# Patient Record
Sex: Male | Born: 1990 | Race: White | Hispanic: No | Marital: Single | Smoking: Never smoker
Health system: Southern US, Community
[De-identification: ages and names within clinical notes are randomized; demographics above are authoritative.]

## PROBLEM LIST (undated history)

## (undated) DIAGNOSIS — J302 Other seasonal allergic rhinitis: Secondary | ICD-10-CM

## (undated) DIAGNOSIS — F419 Anxiety disorder, unspecified: Secondary | ICD-10-CM

## (undated) DIAGNOSIS — F32A Depression, unspecified: Secondary | ICD-10-CM

## (undated) DIAGNOSIS — N200 Calculus of kidney: Secondary | ICD-10-CM

## (undated) DIAGNOSIS — Z87442 Personal history of urinary calculi: Secondary | ICD-10-CM

## (undated) DIAGNOSIS — H6693 Otitis media, unspecified, bilateral: Secondary | ICD-10-CM

## (undated) DIAGNOSIS — K219 Gastro-esophageal reflux disease without esophagitis: Secondary | ICD-10-CM

## (undated) DIAGNOSIS — F329 Major depressive disorder, single episode, unspecified: Secondary | ICD-10-CM

## (undated) DIAGNOSIS — J45909 Unspecified asthma, uncomplicated: Secondary | ICD-10-CM

## (undated) HISTORY — DX: Otitis media, unspecified, bilateral: H66.93

## (undated) HISTORY — DX: Other seasonal allergic rhinitis: J30.2

## (undated) HISTORY — DX: Unspecified asthma, uncomplicated: J45.909

## (undated) HISTORY — DX: Depression, unspecified: F32.A

## (undated) HISTORY — PX: ESOPHAGOGASTRODUODENOSCOPY ENDOSCOPY: SHX5814

## (undated) HISTORY — PX: SKIN LESION EXCISION: SHX2412

## (undated) HISTORY — DX: Anxiety disorder, unspecified: F41.9

## (undated) HISTORY — DX: Gastro-esophageal reflux disease without esophagitis: K21.9

## (undated) HISTORY — PX: WISDOM TOOTH EXTRACTION: SHX21

## (undated) HISTORY — DX: Calculus of kidney: N20.0

## (undated) HISTORY — DX: Major depressive disorder, single episode, unspecified: F32.9

---
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
## 2009-07-23 ENCOUNTER — Emergency Department (HOSPITAL_COMMUNITY): Admission: EM | Admit: 2009-07-23 | Discharge: 2009-07-24 | Payer: Self-pay | Admitting: Emergency Medicine

## 2013-06-05 DIAGNOSIS — F909 Attention-deficit hyperactivity disorder, unspecified type: Secondary | ICD-10-CM

## 2013-06-05 HISTORY — DX: Attention-deficit hyperactivity disorder, unspecified type: F90.9

## 2014-01-19 ENCOUNTER — Telehealth: Payer: Self-pay | Admitting: Medical

## 2014-01-19 ENCOUNTER — Ambulatory Visit (INDEPENDENT_AMBULATORY_CARE_PROVIDER_SITE_OTHER): Payer: 59 | Admitting: Medical

## 2014-01-19 ENCOUNTER — Encounter: Payer: Self-pay | Admitting: Medical

## 2014-01-19 VITALS — BP 112/72 | HR 80 | Temp 98.3°F | Resp 16 | Ht 69.5 in | Wt 173.0 lb

## 2014-01-19 DIAGNOSIS — R41840 Attention and concentration deficit: Secondary | ICD-10-CM

## 2014-01-19 DIAGNOSIS — Z559 Problems related to education and literacy, unspecified: Secondary | ICD-10-CM

## 2014-01-19 DIAGNOSIS — Z113 Encounter for screening for infections with a predominantly sexual mode of transmission: Secondary | ICD-10-CM

## 2014-01-19 DIAGNOSIS — Z0000 Encounter for general adult medical examination without abnormal findings: Secondary | ICD-10-CM

## 2014-01-19 LAB — POCT URINALYSIS DIPSTICK
Bilirubin, UA: NEGATIVE
GLUCOSE UA: NEGATIVE
Ketones, UA: NEGATIVE
Leukocytes, UA: NEGATIVE
NITRITE UA: NEGATIVE
PH UA: 7
Protein, UA: NEGATIVE
RBC UA: NEGATIVE
SPEC GRAV UA: 1.01
Urobilinogen, UA: NEGATIVE

## 2014-01-19 NOTE — Telephone Encounter (Signed)
Refer to [facility] for eval for possible adult ADD vs ruling out other.

## 2014-01-19 NOTE — Telephone Encounter (Signed)
Called [facility] to try to refer pt over but had to leave a message and waiting to get a call back

## 2014-01-19 NOTE — Telephone Encounter (Signed)
GrenadaBrittany from [facility] will e-mail me a referral form and i will fax over info and she will give him a call to set up the appt

## 2014-01-19 NOTE — Progress Notes (Addendum)
Subjective:   HPI  Bradley Douglas is a 23 y.o. male who presents for a complete physical.  New patient today   Preventative care: Last ophthalmology visit: 2007 Last dental visit: 2009 Last colonoscopy: n/a  Last prostate exam: n/a Last EKG: never Last labs: 2 years ago  Prior vaccinations: TD or Tdap: 2009 Influenza: never  Concerns: Wants STD screening today, sexually active.  Thinks he has ADHD.  Mother has ADHD, sisters seems to have the same, aunt has ADHD as well as maternal uncles.  He has been treated for depression and anxiety in the past.  Hasn't seen a doctor specifically for this prior (ADHD).  He did do a questionnaire at college that was abnormally high suggestive ADHD.  Can't focus on things, doesn't complete projects.  Stares off at work.  Falls behind at times with job responsibilities.  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   Has a Dance movement psychotherapistmentor, sets goals, but just can't seem to follow through on things.   Did fine in high school, but hasn't completed his degrees largely due to these focus concerns.   Reviewed their medical, surgical, family, social, medication, and allergy history and updated chart as appropriate.  Past Medical History  Diagnosis Date  . Childhood asthma   . Seasonal allergic rhinitis   . Chronic otitis media of both ears     childhood  . GERD (gastroesophageal reflux disease)   . Anxiety   . Depression     on treatment during '02-2012    Past Surgical History  Procedure Laterality Date  . Wisdom tooth extraction      History   Social History  . Marital Status: Married    Spouse Name: N/A    Number of Children: N/A  . Years of Education: N/A   Occupational History  . Not on file.   Social History Main Topics  . Smoking status: Current Some Day Smoker  . Smokeless tobacco: Not on file  . Alcohol Use: 5.4 oz/week    3 Glasses of wine, 3 Cans of beer, 3 Shots of liquor per week     Comment: 3x week  . Drug Use:  No  . Sexual Activity: Not on file   Other Topics Concern  . Not on file   Social History Narrative   Is taking a break from college, psychology degrees as of 01/2014, working currently as Government social research officerAssistant Salon Director, exercise with cardio, machine weights, stretching.   Was attending school at [employer], went to [employer] for a period of time as well   Lives with 2 friends.     Family History  Problem Relation Age of Onset  . ADD / ADHD Mother   . Mitral valve prolapse Mother   . Cancer Maternal Grandfather     leukemia  . Heart disease Neg Hx   . Diabetes Neg Hx   . Stroke Neg Hx   . Hypertension Other     paternal side    No current outpatient prescriptions on file.  Allergies  Allergen Reactions  . Amoxicillin Swelling    Review of Systems Constitutional: -fever, -chills, -sweats, -unexpected weight change, -decreased appetite, -fatigue Allergy: +sneezing(seasonal), -itching, -congestion Dermatology: -changing moles, --rash, -lumps ENT: -runny nose, -ear pain, -sore throat, -hoarseness, -sinus pain, -teeth pain, - ringing in ears, -hearing loss, -nosebleeds Cardiology: -chest pain, -palpitations, -swelling, -difficulty breathing when lying flat, -waking up short of breath Respiratory: -cough, -shortness of breath, -difficulty breathing with exercise or exertion, -  wheezing, -coughing up blood Gastroenterology: -abdominal pain, -nausea, -vomiting, -diarrhea, -constipation, -blood in stool, -changes in bowel movement, -difficulty swallowing or eating Hematology: -bleeding, -bruising  Musculoskeletal: +joint aches, -muscle aches, -joint swelling, +back pain, -neck pain, -cramping, -changes in gait, +knee pain  Ophthalmology: denies vision changes, eye redness, itching, discharge Urology: -burning with urination, -difficulty urinating, -blood in urine, -urinary frequency, -urgency, -incontinence Neurology: -headache, -weakness, -tingling, -numbness, -memory loss, -falls,  -dizziness Psychology: -depressed mood, -agitation, -sleep problems     Objective:   Physical Exam  BP 112/72  Pulse 80  Temp(Src) 98.3 F (36.8 C) (Oral)  Resp 16  Ht 5' 9.5" (1.765 m)  Wt 173 lb (78.472 kg)  BMI 25.19 kg/m2  General appearance: alert, no distress, WD/WN, white male Skin: scattered macules of torso, arms legs, no particular worrisome lesions, left forearm proximally with 2 6mm oval scars from prior skin biopsies, large tattoo spanning from mid chest to upper thigh along left side of body HEENT: normocephalic, conjunctiva/corneas normal, sclerae anicteric, PERRLA, EOMi, nares patent, no discharge or erythema, pharynx normal Oral cavity: MMM, tongue normal, teeth in good repair Neck: supple, no lymphadenopathy, no thyromegaly, no masses, normal ROM, no bruits Chest: non tender, normal shape and expansion Heart: RRR, normal S1, S2, no murmurs Lungs: CTA bilaterally, no wheezes, rhonchi, or rales Abdomen: +bs, soft, non tender, non distended, no masses, no hepatomegaly, no splenomegaly, no bruits Back: non tender, normal ROM, no scoliosis Musculoskeletal: upper extremities non tender, no obvious deformity, normal ROM throughout, lower extremities non tender, no obvious deformity, normal ROM throughout Extremities: no edema, no cyanosis, no clubbing Pulses: 2+ symmetric, upper and lower extremities, normal cap refill Neurological: alert, oriented x 3, CN2-12 intact, strength normal upper extremities and lower extremities, sensation normal throughout, DTRs 2+ throughout, no cerebellar signs, gait normal Psychiatric: normal affect, behavior normal, pleasant  GU: normal male external genitalia, circumcised, nontender, no masses, no hernia, no lymphadenopathy Rectal: deferred   Assessment and Plan :    Encounter Diagnoses  Name Primary?  . Routine general medical examination at a health care facility Yes  . Screen for STD (sexually transmitted disease)   . Attention  deficit   . School problem     Physical exam - discussed healthy lifestyle, diet, exercise, preventative care, vaccinations, and addressed their concerns.  He declines routine screening labs.  STD screening today, discussed safe sex. Advised yearly flu vaccine. Attention problem, school problems, focus concerns - Adult ADHD-RS-IV with adult prompt questionnaire with Inattentive score of 22 and Hyperactivity-Impulsivity score of 17.   Referral to psychology to help with diagnosis and treatment recommendations. Follow-up pending labs

## 2014-01-20 ENCOUNTER — Telehealth: Payer: Self-pay | Admitting: Medical

## 2014-01-20 LAB — GC/CHLAMYDIA PROBE AMP
CT PROBE, AMP APTIMA: NEGATIVE
GC PROBE AMP APTIMA: NEGATIVE

## 2014-01-20 LAB — HIV ANTIBODY (ROUTINE TESTING W REFLEX): HIV 1&2 Ab, 4th Generation: NONREACTIVE

## 2014-01-20 LAB — RPR

## 2014-01-20 NOTE — Telephone Encounter (Signed)
lm

## 2014-01-29 ENCOUNTER — Telehealth: Payer: Self-pay | Admitting: Internal Medicine

## 2014-01-29 NOTE — Telephone Encounter (Signed)
[employer] CALLED STATING THAT THEY DO NOT A PATIENT WITH THAT NAME THERE WITH RECORDS.Marland Kitchen JUST AN FYI

## 2014-01-29 NOTE — Telephone Encounter (Signed)
noted 

## 2019-03-25 ENCOUNTER — Ambulatory Visit: Payer: Self-pay | Admitting: Physician Assistant

## 2019-03-25 ENCOUNTER — Other Ambulatory Visit: Payer: Self-pay

## 2019-03-25 DIAGNOSIS — Z113 Encounter for screening for infections with a predominantly sexual mode of transmission: Secondary | ICD-10-CM

## 2019-03-25 DIAGNOSIS — Z202 Contact with and (suspected) exposure to infections with a predominantly sexual mode of transmission: Secondary | ICD-10-CM

## 2019-03-25 MED ORDER — AZITHROMYCIN 500 MG PO TABS
1000.0000 mg | ORAL_TABLET | Freq: Once | ORAL | Status: AC
  Administered 2019-03-25: 1000 mg via ORAL

## 2019-03-26 ENCOUNTER — Encounter: Payer: Self-pay | Admitting: Physician Assistant

## 2019-03-26 NOTE — Progress Notes (Signed)
    STI clinic/screening visit  Subjective:  Bradley Douglas is a 28 y.o. male being seen today for an STI screening visit. The patient reports they do not have symptoms.  Patient has the following medical conditions:  There are no active problems to display for this patient.    Chief Complaint  Patient presents with  . SEXUALLY TRANSMITTED DISEASE    HPI  Patient reports that he is a contact to Chlamydia.  Denies any symptoms today but would like a screening.    See flowsheet for further details and programmatic requirements.    The following portions of the patient's history were reviewed and updated as appropriate: allergies, current medications, past medical history, past social history, past surgical history and problem list.  Objective:  There were no vitals filed for this visit.  Physical Exam Constitutional:      General: He is not in acute distress.    Appearance: Normal appearance.  HENT:     Head: Normocephalic and atraumatic.     Mouth/Throat:     Mouth: Mucous membranes are moist.     Pharynx: Oropharynx is clear. No oropharyngeal exudate or posterior oropharyngeal erythema.  Eyes:     Conjunctiva/sclera: Conjunctivae normal.  Neck:     Musculoskeletal: Neck supple.  Pulmonary:     Effort: Pulmonary effort is normal.  Abdominal:     Palpations: Abdomen is soft. There is no mass.     Tenderness: There is no abdominal tenderness. There is no guarding or rebound.  Genitourinary:    Penis: Normal.      Scrotum/Testes: Normal.     Rectum: Normal.     Comments: Pubic area without nits, lice, edema, erythema, lesions or inguinal adenopathy. Penis uncircumcised and without discharge at meatus. Lymphadenopathy:     Cervical: No cervical adenopathy.  Skin:    General: Skin is warm and dry.     Findings: No bruising, erythema, lesion or rash.  Neurological:     Mental Status: He is alert and oriented to person, place, and time.  Psychiatric:        Mood  and Affect: Mood normal.        Behavior: Behavior normal.        Thought Content: Thought content normal.        Judgment: Judgment normal.       Assessment and Plan:  Bradley Douglas is a 28 y.o. male presenting to the [facility] for STI screening  1. Screening for STD (sexually transmitted disease) Patient is without symptoms today. Rec condoms with all sex. Await test results.  Counseled that RN will call if needs to RTC for further treatment once results are back.  - Chlamydia/Gonorrhea [facility] - HIV/HCV [facility] - Syphilis Serology, [facility] - [facility]  2. Chlamydia contact Will treat as a contact to Chlamydia with Azithromycin 1 g po DOT today. No sex for 7 days and until after partner completes treatment. RTC if vomits <2 hr after taking medicine for re-treatment. - azithromycin (ZITHROMAX) tablet 1,000 mg     No follow-ups on file.  No future appointments.  Jerene Dilling, PA

## 2019-04-02 LAB — HM HIV SCREENING LAB: HM HIV Screening: NEGATIVE

## 2019-04-02 LAB — HM HEPATITIS C SCREENING LAB: HM Hepatitis Screen: NEGATIVE

## 2019-11-17 ENCOUNTER — Other Ambulatory Visit: Payer: Self-pay | Admitting: Physician Assistant

## 2019-11-17 DIAGNOSIS — R042 Hemoptysis: Secondary | ICD-10-CM

## 2019-11-19 ENCOUNTER — Ambulatory Visit
Admission: RE | Admit: 2019-11-19 | Discharge: 2019-11-19 | Disposition: A | Discharge status: Home / Self Care | Payer: BLUE CROSS/BLUE SHIELD | Source: Ambulatory Visit | Attending: Physician Assistant | Admitting: Physician Assistant

## 2019-11-19 ENCOUNTER — Other Ambulatory Visit: Payer: Self-pay

## 2019-11-19 ENCOUNTER — Other Ambulatory Visit: Payer: Self-pay | Admitting: Physician Assistant

## 2019-11-19 ENCOUNTER — Ambulatory Visit
Admission: RE | Admit: 2019-11-19 | Discharge: 2019-11-19 | Disposition: A | Discharge status: Home / Self Care | Payer: BLUE CROSS/BLUE SHIELD | Attending: Physician Assistant | Admitting: Physician Assistant

## 2019-11-19 DIAGNOSIS — R042 Hemoptysis: Secondary | ICD-10-CM | POA: Diagnosis not present

## 2019-11-21 ENCOUNTER — Ambulatory Visit: Admission: RE | Admit: 2019-11-21 | Payer: BLUE CROSS/BLUE SHIELD | Source: Ambulatory Visit

## 2019-12-01 ENCOUNTER — Other Ambulatory Visit: Payer: Self-pay

## 2019-12-01 ENCOUNTER — Ambulatory Visit
Admission: RE | Admit: 2019-12-01 | Discharge: 2019-12-01 | Disposition: A | Discharge status: Home / Self Care | Payer: BLUE CROSS/BLUE SHIELD | Source: Ambulatory Visit | Attending: Physician Assistant | Admitting: Physician Assistant

## 2019-12-01 DIAGNOSIS — R042 Hemoptysis: Secondary | ICD-10-CM | POA: Diagnosis not present

## 2019-12-01 MED ORDER — IOHEXOL 300 MG/ML  SOLN
75.0000 mL | Freq: Once | INTRAMUSCULAR | Status: AC | PRN
  Administered 2019-12-01: 75 mL via INTRAVENOUS

## 2019-12-24 ENCOUNTER — Encounter: Payer: Self-pay | Admitting: Pulmonary Disease

## 2019-12-24 ENCOUNTER — Other Ambulatory Visit: Payer: Self-pay

## 2019-12-24 ENCOUNTER — Ambulatory Visit (INDEPENDENT_AMBULATORY_CARE_PROVIDER_SITE_OTHER): Payer: BLUE CROSS/BLUE SHIELD

## 2019-12-24 ENCOUNTER — Ambulatory Visit: Payer: BLUE CROSS/BLUE SHIELD | Admitting: Pulmonary Disease

## 2019-12-24 VITALS — BP 124/70 | HR 130 | Temp 98.4°F | Ht 70.0 in | Wt 194.6 lb

## 2019-12-24 DIAGNOSIS — R000 Tachycardia, unspecified: Secondary | ICD-10-CM | POA: Diagnosis not present

## 2019-12-24 DIAGNOSIS — R042 Hemoptysis: Secondary | ICD-10-CM

## 2019-12-24 DIAGNOSIS — R0601 Orthopnea: Secondary | ICD-10-CM

## 2019-12-24 DIAGNOSIS — K219 Gastro-esophageal reflux disease without esophagitis: Secondary | ICD-10-CM

## 2019-12-24 DIAGNOSIS — R0602 Shortness of breath: Secondary | ICD-10-CM

## 2019-12-24 MED ORDER — ESOMEPRAZOLE MAGNESIUM 40 MG PO CPDR: 40.0000 mg | DELAYED_RELEASE_CAPSULE | Freq: Every day | ORAL | 2 refills | Status: DC

## 2019-12-24 NOTE — Patient Instructions (Signed)
We are going to investigate with an event monitor (it used to be called a Holter monitor) to see what is going on with your heart rate and other issues.  Also I am going to check an echocardiogram.  We are going to get also a breathing tests.  Going to start you on medication for reflux.   We will see you back in 2 to 3 weeks time call sooner should any new difficulties arise.

## 2019-12-26 ENCOUNTER — Telehealth: Payer: Self-pay

## 2019-12-26 NOTE — Telephone Encounter (Signed)
Pt is aware of date/time of covid test.   

## 2019-12-29 ENCOUNTER — Other Ambulatory Visit
Admission: RE | Admit: 2019-12-29 | Discharge: 2019-12-29 | Disposition: A | Discharge status: Home / Self Care | Payer: BLUE CROSS/BLUE SHIELD | Source: Ambulatory Visit | Attending: Urology | Admitting: Urology

## 2019-12-29 ENCOUNTER — Other Ambulatory Visit: Payer: Self-pay

## 2019-12-29 DIAGNOSIS — Z20822 Contact with and (suspected) exposure to covid-19: Secondary | ICD-10-CM | POA: Insufficient documentation

## 2019-12-29 DIAGNOSIS — Z01812 Encounter for preprocedural laboratory examination: Secondary | ICD-10-CM | POA: Insufficient documentation

## 2019-12-30 LAB — SARS CORONAVIRUS 2 (TAT 6-24 HRS): SARS Coronavirus 2: NEGATIVE

## 2019-12-31 ENCOUNTER — Other Ambulatory Visit: Payer: Self-pay

## 2019-12-31 ENCOUNTER — Ambulatory Visit: Payer: BLUE CROSS/BLUE SHIELD | Attending: Pulmonary Disease

## 2019-12-31 DIAGNOSIS — R000 Tachycardia, unspecified: Secondary | ICD-10-CM

## 2019-12-31 DIAGNOSIS — R05 Cough: Secondary | ICD-10-CM | POA: Diagnosis not present

## 2019-12-31 DIAGNOSIS — R0602 Shortness of breath: Secondary | ICD-10-CM | POA: Diagnosis not present

## 2019-12-31 DIAGNOSIS — R0601 Orthopnea: Secondary | ICD-10-CM | POA: Diagnosis not present

## 2019-12-31 DIAGNOSIS — R0682 Tachypnea, not elsewhere classified: Secondary | ICD-10-CM | POA: Insufficient documentation

## 2019-12-31 LAB — PULMONARY FUNCTION TEST ARMC ONLY
DL/VA % pred: 125 %
DL/VA: 6.13 ml/min/mmHg/L
DLCO unc % pred: 127 %
DLCO unc: 41.34 ml/min/mmHg
FEF 25-75 Post: 3.89 L/sec
FEF 25-75 Pre: 4.63 L/sec
FEF2575-%Change-Post: -16 %
FEF2575-%Pred-Post: 87 %
FEF2575-%Pred-Pre: 104 %
FEV1-%Change-Post: -3 %
FEV1-%Pred-Post: 88 %
FEV1-%Pred-Pre: 92 %
FEV1-Post: 3.94 L
FEV1-Pre: 4.09 L
FEV1FVC-%Change-Post: -7 %
FEV1FVC-%Pred-Pre: 106 %
FEV6-%Change-Post: 4 %
FEV6-%Pred-Post: 90 %
FEV6-%Pred-Pre: 86 %
FEV6-Post: 4.9 L
FEV6-Pre: 4.67 L
FEV6FVC-%Pred-Post: 101 %
FEV6FVC-%Pred-Pre: 101 %
FVC-%Change-Post: 4 %
FVC-%Pred-Post: 89 %
FVC-%Pred-Pre: 86 %
FVC-Post: 4.9 L
Post FEV1/FVC ratio: 81 %
Post FEV6/FVC ratio: 100 %
Pre FEV1/FVC ratio: 87 %
Pre FEV6/FVC Ratio: 100 %
RV % pred: 90 %
RV: 1.49 L
TLC % pred: 93 %
TLC: 6.48 L

## 2019-12-31 MED ORDER — ALBUTEROL SULFATE (2.5 MG/3ML) 0.083% IN NEBU
2.5000 mg | INHALATION_SOLUTION | Freq: Once | RESPIRATORY_TRACT | Status: AC
  Administered 2019-12-31: 2.5 mg via RESPIRATORY_TRACT
  Filled 2019-12-31: qty 3

## 2020-01-06 ENCOUNTER — Other Ambulatory Visit: Payer: Self-pay

## 2020-01-06 ENCOUNTER — Ambulatory Visit
Admission: RE | Admit: 2020-01-06 | Discharge: 2020-01-06 | Disposition: A | Discharge status: Home / Self Care | Payer: BLUE CROSS/BLUE SHIELD | Source: Ambulatory Visit | Attending: Pulmonary Disease | Admitting: Pulmonary Disease

## 2020-01-06 DIAGNOSIS — R0601 Orthopnea: Secondary | ICD-10-CM | POA: Diagnosis not present

## 2020-01-06 DIAGNOSIS — R000 Tachycardia, unspecified: Secondary | ICD-10-CM

## 2020-01-06 LAB — ECHOCARDIOGRAM COMPLETE
AR max vel: 2.76 cm2
AV Area VTI: 2.4 cm2
AV Area mean vel: 2.62 cm2
AV Mean grad: 3.5 mmHg
AV Peak grad: 6 mmHg
Ao pk vel: 1.22 m/s
Area-P 1/2: 3.89 cm2
S' Lateral: 3.42 cm

## 2020-01-06 NOTE — Progress Notes (Signed)
*  PRELIMINARY RESULTS* Echocardiogram 2D Echocardiogram has been performed.  Bradley Douglas 01/06/2020, 11:39 AM

## 2020-01-09 ENCOUNTER — Other Ambulatory Visit: Payer: Self-pay

## 2020-01-09 ENCOUNTER — Encounter: Payer: Self-pay | Admitting: Cardiology

## 2020-01-09 ENCOUNTER — Ambulatory Visit: Payer: BLUE CROSS/BLUE SHIELD | Admitting: Cardiology

## 2020-01-09 VITALS — BP 118/80 | HR 109 | Ht 70.0 in | Wt 194.0 lb

## 2020-01-09 DIAGNOSIS — R000 Tachycardia, unspecified: Secondary | ICD-10-CM

## 2020-01-09 DIAGNOSIS — K2100 Gastro-esophageal reflux disease with esophagitis, without bleeding: Secondary | ICD-10-CM | POA: Diagnosis not present

## 2020-01-09 MED ORDER — METOPROLOL SUCCINATE ER 50 MG PO TB24: 25.0000 mg | ORAL_TABLET | Freq: Every day | ORAL | 3 refills | Status: DC

## 2020-01-09 NOTE — Progress Notes (Signed)
Cardiology Office Note:    Date:  01/09/2020   ID:  Bradley Douglas, DOB 22-Mar-1991, MRN [id number]  PCP:  Jac Canavan, PA-C  [facility] Cardiologist:  Debbe Odea, MD  [facility] Electrophysiologist:  None   Referring MD: Salena Saner, MD   Chief Complaint  Patient presents with  . New Patient (Initial Visit)    Referred for tachycardia. Medications verbally reviewed with patient.    Bradley Douglas is a 29 y.o. male who is being seen today for the evaluation of tachycardia at the request of Salena Saner, MD.   History of Present Illness:    Bradley Douglas is a 29 y.o. male with a hx of anxiety, depression, ADHD ,GERD who presents due to tachycardia.  Patient states having symptoms of elevated heart rates for over a year now.  He sometimes feels palpitations.  He has a smart watch and has noted heart rates up to 120s.  He takes Vyvanse for ADHD for about 5 years now.  He otherwise denies any history of cardiac disease.  He is working with his ADHD physician to decrease his Vyvanse dose.   Echocardiogram on 01/13/2020 showed normal systolic function, EF 50 to 55%.  Past Medical History:  Diagnosis Date  . Anxiety   . Childhood asthma   . Chronic otitis media of both ears    childhood  . Depression    on treatment during '02-2012  . GERD (gastroesophageal reflux disease)   . Seasonal allergic rhinitis     Past Surgical History:  Procedure Laterality Date  . WISDOM TOOTH EXTRACTION      Current Medications: Current Meds  Medication Sig  . buPROPion (WELLBUTRIN XL) 300 MG 24 hr tablet Take 300 mg by mouth daily.  Marland Kitchen esomeprazole (NEXIUM) 40 MG capsule Take 1 capsule (40 mg total) by mouth daily.  Marland Kitchen lisdexamfetamine (VYVANSE) 60 MG capsule Take 1 capsule by mouth daily.     Allergies:   Amoxicillin   Social History   Socioeconomic History  . Marital status: Single    Spouse name: Not on file  . Number of children: Not on file    . Years of education: Not on file  . Highest education level: Not on file  Occupational History  . Not on file  Tobacco Use  . Smoking status: Never Smoker  . Smokeless tobacco: Never Used  Vaping Use  . Vaping Use: Never used  Substance and Sexual Activity  . Alcohol use: Yes    Alcohol/week: 9.0 standard drinks    Types: 3 Glasses of wine, 3 Cans of beer, 3 Shots of liquor per week    Comment: 3x week  . Drug use: No  . Sexual activity: Yes    Birth control/protection: Condom  Other Topics Concern  . Not on file  Social History Narrative   Is taking a break from college, psychology degrees as of 01/2014, working currently as Government social research officer, exercise with cardio, machine weights, stretching.   Was attending school at [employer], went to [employer] for a period of time as well   Lives with 2 friends.    Social Determinants of Health   Financial Resource Strain:   . Difficulty of Paying Living Expenses:   Food Insecurity:   . Worried About Programme researcher, broadcasting/film/video in the Last Year:   . Barista in the Last Year:   Transportation Needs:   . Freight forwarder (Medical):   Marland Kitchen  Lack of Transportation (Non-Medical):   Physical Activity:   . Days of Exercise per Week:   . Minutes of Exercise per Session:   Stress:   . Feeling of Stress :   Social Connections:   . Frequency of Communication with Friends and Family:   . Frequency of Social Gatherings with Friends and Family:   . Attends Religious Services:   . Active Member of Clubs or Organizations:   . Attends Banker Meetings:   Marland Kitchen Marital Status:      Family History: The patient's family history includes ADD / ADHD in his mother; Cancer in his maternal grandfather; Hypertension in an other family member; Mitral valve prolapse in his mother. There is no history of Heart disease, Diabetes, or Stroke.  ROS:   Please see the history of present illness.     All other systems reviewed and are  negative.  EKGs/Labs/Other Studies Reviewed:    The following studies were reviewed today:   EKG:  EKG is  ordered today.  The ekg ordered today demonstrates sinus tachycardia otherwise normal ECG, heart rate 109  Recent Labs: No results found for requested labs within last 8760 hours.  Recent Lipid Panel No results found for: CHOL, TRIG, HDL, CHOLHDL, VLDL, LDLCALC, LDLDIRECT  Physical Exam:    VS:  BP 118/80 (BP Location: Right Arm, Cuff Size: Normal)   Pulse (!) 109   Ht 5\' 10"  (1.778 m)   Wt 194 lb (88 kg)   SpO2 98%   BMI 27.84 kg/m     Wt Readings from Last 3 Encounters:  01/09/20 194 lb (88 kg)  12/24/19 194 lb 9.6 oz (88.3 kg)  01/19/14 173 lb (78.5 kg)     GEN:  Well nourished, well developed in no acute distress HEENT: Normal NECK: No JVD; No carotid bruits LYMPHATICS: No lymphadenopathy CARDIAC: Tachycardic, regular, no murmurs, rubs, gallops RESPIRATORY:  Clear to auscultation without rales, wheezing or rhonchi  ABDOMEN: Soft, non-tender, non-distended MUSCULOSKELETAL:  No edema; No deformity  SKIN: Warm and dry NEUROLOGIC:  Alert and oriented x 3 PSYCHIATRIC:  Normal affect   ASSESSMENT:    1. Sinus tachycardia   2. Gastroesophageal reflux disease with esophagitis without hemorrhage    PLAN:    In order of problems listed above:  1. Patient with sinus tachycardia likely secondary to medication/Vyvanse.  Last echocardiogram showed low normal systolic function, EF 50 to 55%.  Prolonged tachycardiac and sometimes decreased LV function.  We will start Toprol-XL 25 mg daily to help with elevated heart rates.  Plan to titrate as needed depending on patient's symptoms and heart rate. 2. History of GERD, continue PPI.  Follow-up in 6 weeks.  This note was generated in part or whole with voice recognition software. Voice recognition is usually quite accurate but there are transcription errors that can and very often do occur. I apologize for any  typographical errors that were not detected and corrected.  Medication Adjustments/Labs and Tests Ordered: Current medicines are reviewed at length with the patient today.  Concerns regarding medicines are outlined above.  Orders Placed This Encounter  Procedures  . EKG 12-Lead   Meds ordered this encounter  Medications  . metoprolol succinate (TOPROL-XL) 50 MG 24 hr tablet    Sig: Take 0.5 tablets (25 mg total) by mouth daily. Take with or immediately following a meal.    Dispense:  15 tablet    Refill:  3    Patient Instructions  Medication Instructions:  Your physician has recommended you make the following change in your medication:  START taking metoprolol succinate (TOPROL-XL) 50 MG 24 hr tablet: Take 0.5 tablets (25 mg total) by mouth daily.  *If you need a refill on your cardiac medications before your next appointment, please call your pharmacy*   Lab Work: None Ordered If you have labs (blood work) drawn today and your tests are completely normal, you will receive your results only by: Marland Kitchen MyChart Message (if you have MyChart) OR . A paper copy in the mail If you have any lab test that is abnormal or we need to change your treatment, we will call you to review the results.   Testing/Procedures: None Ordered   Follow-Up: At [facility], you and your health needs are our priority.  As part of our continuing mission to provide you with exceptional heart care, we have created designated Provider Care Teams.  These Care Teams include your primary Cardiologist (physician) and Advanced Practice Providers (APPs -  Physician Assistants and Nurse Practitioners) who all work together to provide you with the care you need, when you need it.  We recommend signing up for the patient portal called "MyChart".  Sign up information is provided on this After Visit Summary.  MyChart is used to connect with patients for Virtual Visits (Telemedicine).  Patients are able to view  lab/test results, encounter notes, upcoming appointments, etc.  Non-urgent messages can be sent to your provider as well.   To learn more about what you can do with MyChart, go to [email or web address].    Your next appointment:   6 week(s)  The format for your next appointment:   In Person  Provider:   Debbe Odea, MD   Other Instructions      Signed, Debbe Odea, MD  01/09/2020 1:23 PM    [facility]

## 2020-01-09 NOTE — Patient Instructions (Signed)
Medication Instructions:   Your physician has recommended you make the following change in your medication:  START taking metoprolol succinate (TOPROL-XL) 50 MG 24 hr tablet: Take 0.5 tablets (25 mg total) by mouth daily.  *If you need a refill on your cardiac medications before your next appointment, please call your pharmacy*   Lab Work: None Ordered If you have labs (blood work) drawn today and your tests are completely normal, you will receive your results only by: Marland Kitchen MyChart Message (if you have MyChart) OR . A paper copy in the mail If you have any lab test that is abnormal or we need to change your treatment, we will call you to review the results.   Testing/Procedures: None Ordered   Follow-Up: At [facility], you and your health needs are our priority.  As part of our continuing mission to provide you with exceptional heart care, we have created designated Provider Care Teams.  These Care Teams include your primary Cardiologist (physician) and Advanced Practice Providers (APPs -  Physician Assistants and Nurse Practitioners) who all work together to provide you with the care you need, when you need it.  We recommend signing up for the patient portal called "MyChart".  Sign up information is provided on this After Visit Summary.  MyChart is used to connect with patients for Virtual Visits (Telemedicine).  Patients are able to view lab/test results, encounter notes, upcoming appointments, etc.  Non-urgent messages can be sent to your provider as well.   To learn more about what you can do with MyChart, go to [email or web address].    Your next appointment:   6 week(s)  The format for your next appointment:   In Person  Provider:   Debbe Odea, MD   Other Instructions

## 2020-01-13 ENCOUNTER — Ambulatory Visit: Payer: BLUE CROSS/BLUE SHIELD | Admitting: Pulmonary Disease

## 2020-01-13 ENCOUNTER — Other Ambulatory Visit: Payer: Self-pay

## 2020-01-13 ENCOUNTER — Encounter: Payer: Self-pay | Admitting: Pulmonary Disease

## 2020-01-13 VITALS — BP 124/76 | HR 85 | Temp 98.4°F | Ht 70.0 in | Wt 193.8 lb

## 2020-01-13 DIAGNOSIS — K219 Gastro-esophageal reflux disease without esophagitis: Secondary | ICD-10-CM

## 2020-01-13 DIAGNOSIS — R0602 Shortness of breath: Secondary | ICD-10-CM

## 2020-01-13 DIAGNOSIS — Z8719 Personal history of other diseases of the digestive system: Secondary | ICD-10-CM

## 2020-01-13 NOTE — Progress Notes (Signed)
Subjective:    Patient ID: Bradley Douglas, male    DOB: [birth year], 29 y.o.   MRN: [id number] Patient Care Team: Pcp, No as PCP - General Debbe Odea, MD as PCP - Cardiology (Cardiology)  Chief Complaint  Patient presents with   Follow-up    breathing has improved since starting Nexium. c/o mild throat clearing.     HPI Patient is a 29 year old previously seen for the issue of dyspnea on 24 December 2019.  At that time he was noted to have significant tachycardia in the setting of ADHD medications as well as hemoptysis.  He was referred to cardiology and placed on metoprolol.  This medication has helped significantly with his shortness of breath.  He was initially referred by ENT for what appeared to have been a follow-up with a follicular cyst.  He endorses at that time having "hemoptysis" which per the examiner was noted to be half a teaspoon, bright red however the patient states that is more streaky in nature.  The patient states that he has had cough for approximately a year and a half and that the streaky "hemoptysis" roughly 2 months prior.  The patient was initially evaluated on 24 December 2019 at that time it was noted that the patient had CT soft tissue neck and chest CT both of these studies were unremarkable.  On further questioning it was noted that the patient was having significant gastroesophageal reflux symptoms, chest discomfort and the "hemoptysis" was actually hematemesis.  We instituted therapy with Nexium 40 mg daily and referred the patient to GI.  GI concurred that the patient was likely having hematemesis.  This issue has now resolved with the Nexium.  At present, he has not had EGD however he has been advised by GI that if his symptoms recur he will need EGD.  The patient notes that between the metoprolol and Nexium his shortness of breath has improved dramatically.  His cough is also resolved he only has occasional mild throat clearing.  He does not endorse any other  symptomatology.  The patient has had cardiac evaluation, event monitor and echocardiogram all which have been normal.  Overall he feels well and looks well.   Review of Systems A 10 point review of systems was performed and it is as noted above otherwise negative.  Past Medical History:  Diagnosis Date   ADHD 2015   Anxiety    Chronic otitis media of both ears    childhood   Depression    on treatment during '02-2012   GERD (gastroesophageal reflux disease)    History of kidney stones    Seasonal allergic rhinitis    Past Surgical History:  Procedure Laterality Date   ESOPHAGOGASTRODUODENOSCOPY ENDOSCOPY     SKIN LESION EXCISION     abd inner thigh back left thigh   WISDOM TOOTH EXTRACTION     Family History  Problem Relation Age of Onset   ADD / ADHD Mother    Mitral valve prolapse Mother    Cancer Maternal Grandfather        leukemia   Hypertension Other        paternal side   Cancer Maternal Grandmother    Heart disease Neg Hx    Diabetes Neg Hx    Stroke Neg Hx    Social History   Tobacco Use   Smoking status: Never   Smokeless tobacco: Never  Substance Use Topics   Alcohol use: Yes    Alcohol/week: 6.0 standard  drinks of alcohol    Types: 3 Cans of beer, 3 Shots of liquor per week    Comment: once a month   Allergies  Allergen Reactions   Amoxicillin Swelling    As a child.  Swelling of face and red blotches   Current Meds  Medication Sig   buPROPion (WELLBUTRIN XL) 150 MG 24 hr tablet Take 150 mg by mouth daily.   esomeprazole (NEXIUM) 40 MG capsule Take 1 capsule (40 mg total) by mouth daily.   lisdexamfetamine (VYVANSE) 60 MG capsule Take 1 capsule by mouth daily.   metoprolol succinate (TOPROL-XL) 50 MG 24 hr tablet Take 0.5 tablets (25 mg total) by mouth daily. Take with or immediately following a meal.   Immunization History  Administered Date(s) Administered   PFIZER(Purple Top)SARS-COV-2 Vaccination 09/15/2019, 10/16/2019        Objective:   Physical Exam BP 124/76 (BP Location: Left Arm, Cuff Size: Normal)   Pulse 85   Temp 98.4 F (36.9 C) (Temporal)   Ht 5\' 10"  (1.778 m)   Wt 193 lb 12.8 oz (87.9 kg)   SpO2 96%   BMI 27.81 kg/m   SpO2: 96 % O2 Device: None (Room air)  GENERAL: Well-developed, well-nourished gentleman, no acute distress.  Fully ambulatory. HEAD: Normocephalic, atraumatic.  EYES: Pupils equal, round, reactive to light.  No scleral icterus.  MOUTH: Nose/mouth/throat not examined due to institutional masking requirements for COVID-19. NECK: Supple. No thyromegaly. Trachea midline. No JVD.  No adenopathy. PULMONARY: Good air entry bilaterally.  No adventitious sounds. CARDIOVASCULAR: S1 and S2. Regular rate and rhythm.  No rubs, murmurs or gallops heard. ABDOMEN: Benign. MUSCULOSKELETAL: No joint deformity, no clubbing, no edema.  NEUROLOGIC: No overt focal deficit, no gait disturbance, speech is fluent. SKIN: Intact,warm,dry. PSYCH: Mood and behavior normal      Assessment & Plan:     ICD-10-CM   1. SOB (shortness of breath)  R06.02    Resolved Notes metoprolol and Nexium have helped    2. Gastroesophageal reflux disease, unspecified whether esophagitis present  K21.9    Suspect patient had mild esophagitis Symptoms totally resolved with Nexium    3. History of hematemesis  Z87.19    Initially described as "hemoptysis" This was actually hematemesis Resolved      Patient has been instructed to continue his medications.  Will see the patient back in 2 to 3 months time he is to contact us sooner should any new difficulties arise.  Gailen Shelter, MD Advanced [facility]ch[facility]    *This note was dictated using voice recognition software/Dragon.  Despite best efforts to proofread, errors can occur which can change the meaning. Any transcriptional errors that result from this process are unintentional and may not be fully corrected at the time  of dictation.

## 2020-01-13 NOTE — Patient Instructions (Signed)
All of your test came back normal which is good.  I am glad that the Nexium is working for you and also the metoprolol for your heart.  Continue your medications and we will see you back in 2 to 3 months time call sooner should any new difficulties arise.

## 2020-01-13 NOTE — Progress Notes (Signed)
Noted, thank you for your help with this patient.

## 2020-02-12 ENCOUNTER — Other Ambulatory Visit: Payer: Self-pay

## 2020-02-12 ENCOUNTER — Ambulatory Visit: Payer: BLUE CROSS/BLUE SHIELD | Admitting: Nurse Practitioner

## 2020-02-12 ENCOUNTER — Encounter: Payer: Self-pay | Admitting: Nurse Practitioner

## 2020-02-12 ENCOUNTER — Other Ambulatory Visit (HOSPITAL_COMMUNITY)
Admission: RE | Admit: 2020-02-12 | Discharge: 2020-02-12 | Disposition: A | Discharge status: Home / Self Care | Payer: BLUE CROSS/BLUE SHIELD | Source: Ambulatory Visit | Attending: Nurse Practitioner | Admitting: Nurse Practitioner

## 2020-02-12 VITALS — Temp 98.9°F | Ht 70.0 in | Wt 199.4 lb

## 2020-02-12 DIAGNOSIS — E663 Overweight: Secondary | ICD-10-CM | POA: Diagnosis not present

## 2020-02-12 DIAGNOSIS — N5319 Other ejaculatory dysfunction: Secondary | ICD-10-CM | POA: Diagnosis present

## 2020-02-12 DIAGNOSIS — Z0000 Encounter for general adult medical examination without abnormal findings: Secondary | ICD-10-CM

## 2020-02-12 DIAGNOSIS — K6289 Other specified diseases of anus and rectum: Secondary | ICD-10-CM

## 2020-02-12 DIAGNOSIS — N50811 Right testicular pain: Secondary | ICD-10-CM | POA: Diagnosis present

## 2020-02-12 DIAGNOSIS — F909 Attention-deficit hyperactivity disorder, unspecified type: Secondary | ICD-10-CM

## 2020-02-12 DIAGNOSIS — R300 Dysuria: Secondary | ICD-10-CM

## 2020-02-12 DIAGNOSIS — Z6828 Body mass index (BMI) 28.0-28.9, adult: Secondary | ICD-10-CM | POA: Diagnosis not present

## 2020-02-12 DIAGNOSIS — N50812 Left testicular pain: Secondary | ICD-10-CM

## 2020-02-12 NOTE — Progress Notes (Signed)
Established Patient Office Visit  Subjective:  Patient ID: Bradley Douglas, male    DOB: 04-Apr-1991  Age: 29 y.o. MRN: [id number]  CC:  Chief Complaint  Patient presents with  . New Patient (Initial Visit)    Testicular pain x2years,  Senstive testicles. Feels a lump in testicles during self evaluation. Ejaculate Is solid and holding shape. no discoloration. Pain in area between anus and Genitals. Experiencing a Twisting pain in testicles. Has not been hit in genitals. No pain when urinating or UTI symtpoms. Feeling that something is stuck in urethra during urination/ejaculation  . Hemorrhoids    HPI Bradley Douglas is a 29 year old male with a history of anxiety, depression, ADHD, GERD, tachycardia, nephrolithiasis, who presents to establish care with primary care.  He has concerns about a 2-year history of intermittent testicular pain, and abnormal ejaculation.  He also has pain in the rectal area and thinks his prostate may be irritated.  He has had anal pain at times with a hard bowel movement, and has seen a little pink on the tissue in the past.  He takes Metamucil and has regular bowel movements daily.  He reports he did not get health insurance until this year, as explanation for delay in seeking medical care.  Testicular pain mainly  the left and sometimes the right: Non tender to touch, and not swollen, but the left may feel a little more spongy than the right.  He has noted no penile discharge, or hematuria.  He reports his ejaculate appears thick sometimes and even solid-like.  He has seen no blood in the ejaculate.  He may have a little urinary urgency at times but no burning, cloudy urine, frequency, or flank pain.  No fevers or chills.  He will get bilateral lower back discomfort if he sits a lot at his desk.  He reports a history of kidney stones, treated in [place] by urologist, but is not active.  He does not think he has a current kidney stone.  He currently does not have  a male sexual partner.  He reports no sexual activity in almost a year and last STI comprehensive screening was negative October 2020.  Rectal pain: He reports that sometimes when he has a bowel movement he feels like he is passing a jagged rock.  He has no symptoms currently.  He has noted no hemorrhoids, and reports no history of fissures or tears. Light pink on the tissue. Reports a normal  BM on metamucil- no constipation. He has an appointment with Dr. Servando Snare next Tues arranged.    Chronic cough with hemoptysis:He saw Dr. Jayme Cloud in June  for chronic cough of 1-2 year duration and onset of hemoptysis in April, SOB, pain left ear and trouble swallowing.  She prescribed Nexium and his cough improved.  11/20/2019: CXR: Heart size and mediastinal contours are within normal.  Both lungs are clear.  No active cardiopulmonary disease.  12/01/2019 CT soft tissue of the neck with contrast: No soft tissue neck mass or pathologically enlarged cervical chain lymph nodes identified.  Unremarkable exam.  12/01/2019 chest CT with contrast: Negative for acute abnormality to correlate with his clinical symptoms.  He had a chest x-ray.   GERD: He presents on Nexium which has controlled chronic cough.    Recommend GI  opinion on need for EGD.  Tachycardia: He has heart rate up to 120s.  He was placed on Toprol-XL 25 mg daily by cardiology.  Echocardiogram on 01/13/2020 showed normal  systolic function, EF 50 to 55%.  The cause of the tachycardia may be related to the Vyvanse.   Depression/anxiety/ADHD: Maintained on Wellbutrin 300 mg in 24 hours and Vyvanse 60 mg daily for ADHD for about 5 years. He has been working with Dr. Andria Meuse at [place] attention specialist to titrate the dose down.   Past Medical History:  Diagnosis Date  . ADHD 2015  . Anxiety   . Chronic otitis media of both ears    childhood  . Depression    on treatment during '02-2012  . GERD (gastroesophageal reflux disease)   .  Nephrolithiasis   . Seasonal allergic rhinitis     Past Surgical History:  Procedure Laterality Date  . WISDOM TOOTH EXTRACTION      Family History  Problem Relation Age of Onset  . ADD / ADHD Mother   . Mitral valve prolapse Mother   . Cancer Maternal Grandfather        leukemia  . Hypertension Other        paternal side  . Cancer Maternal Grandmother   . Heart disease Neg Hx   . Diabetes Neg Hx   . Stroke Neg Hx     Social History   Socioeconomic History  . Marital status: Single    Spouse name: Not on file  . Number of children: Not on file  . Years of education: Not on file  . Highest education level: Not on file  Occupational History  . Occupation: Multimedia programmer   Tobacco Use  . Smoking status: Never Smoker  . Smokeless tobacco: Never Used  Vaping Use  . Vaping Use: Never used  Substance and Sexual Activity  . Alcohol use: Yes    Alcohol/week: 9.0 standard drinks    Types: 3 Glasses of wine, 3 Cans of beer, 3 Shots of liquor per week    Comment: 3x week  . Drug use: No  . Sexual activity: Yes    Birth control/protection: Condom    Comment: Sexual partners: male   Other Topics Concern  . Not on file  Social History Narrative   He works as a Multimedia programmer.       Social Determinants of Health   Financial Resource Strain:   . Difficulty of Paying Living Expenses: Not on file  Food Insecurity:   . Worried About Programme researcher, broadcasting/film/video in the Last Year: Not on file  . Ran Out of Food in the Last Year: Not on file  Transportation Needs:   . Lack of Transportation (Medical): Not on file  . Lack of Transportation (Non-Medical): Not on file  Physical Activity:   . Days of Exercise per Week: Not on file  . Minutes of Exercise per Session: Not on file  Stress:   . Feeling of Stress : Not on file  Social Connections:   . Frequency of Communication with Friends and Family: Not on file  . Frequency of Social Gatherings with Friends and Family: Not on  file  . Attends Religious Services: Not on file  . Active Member of Clubs or Organizations: Not on file  . Attends Banker Meetings: Not on file  . Marital Status: Not on file  Intimate Partner Violence:   . Fear of Current or Ex-Partner: Not on file  . Emotionally Abused: Not on file  . Physically Abused: Not on file  . Sexually Abused: Not on file    Outpatient Medications Prior to Visit  Medication Sig  Dispense Refill  . buPROPion (WELLBUTRIN XL) 300 MG 24 hr tablet Take 300 mg by mouth daily.    Marland Kitchen esomeprazole (NEXIUM) 40 MG capsule Take 1 capsule (40 mg total) by mouth daily. 30 capsule 2  . lisdexamfetamine (VYVANSE) 60 MG capsule Take 1 capsule by mouth daily.    . metoprolol succinate (TOPROL-XL) 50 MG 24 hr tablet Take 0.5 tablets (25 mg total) by mouth daily. Take with or immediately following a meal. 15 tablet 3   No facility-administered medications prior to visit.    Allergies  Allergen Reactions  . Amoxicillin Swelling    Review of Systems  Constitutional: Positive for fever. Negative for chills.  HENT: Negative.   Eyes: Negative.   Respiratory: Negative.   Cardiovascular: Negative.   Gastrointestinal: Positive for blood in stool, constipation and rectal pain. Negative for abdominal pain, diarrhea, nausea and vomiting.       GERD is controlled on Nexium daily- ordered by PULM for cough. No asthma. He has been on it less month. GI appt- this month.   Endocrine: Negative.   Genitourinary: Positive for dysuria, scrotal swelling and testicular pain. Negative for decreased urine volume, difficulty urinating, discharge, flank pain, frequency, hematuria, penile pain, penile swelling and urgency.  Musculoskeletal: Positive for back pain.       Lumbar back off and on - not now.   Skin: Negative.   Allergic/Immunologic: Positive for environmental allergies.       Nasocort for dust mites.   Neurological: Negative for dizziness, seizures, light-headedness and  headaches.  Hematological: Negative for adenopathy. Does not bruise/bleed easily.  Psychiatric/Behavioral:       History of ADHD- and depression and anxiety improved. Goes to therapy. On Wellbutrin prescribed by ADHD specialist. Beta blocker helps with the physical SE of Vyvanse.  No SI/HI.      Objective:    Physical Exam Vitals reviewed. Exam conducted with a chaperone present.  Constitutional:      Appearance: Normal appearance. He is normal weight.  HENT:     Head: Normocephalic.  Eyes:     Pupils: Pupils are equal, round, and reactive to light.  Cardiovascular:     Rate and Rhythm: Normal rate and regular rhythm.     Pulses: Normal pulses.     Heart sounds: Normal heart sounds.  Pulmonary:     Effort: Pulmonary effort is normal.     Breath sounds: Normal breath sounds.  Abdominal:     Palpations: Abdomen is soft.     Tenderness: There is no abdominal tenderness.     Hernia: There is no hernia in the left inguinal area or right inguinal area.  Genitourinary:    Penis: Normal.      Testes: Normal.        Right: Mass, tenderness, swelling or testicular hydrocele not present.        Left: Mass, tenderness, swelling or testicular hydrocele not present.     Rectum: No anal fissure or external hemorrhoid.     Comments: Visual inspection of the rectum shows no hemorrhoid or fissure.  Musculoskeletal:        General: Normal range of motion.     Cervical back: Neck supple.  Lymphadenopathy:     Lower Body: No right inguinal adenopathy. No left inguinal adenopathy.  Skin:    General: Skin is warm and dry.  Neurological:     Mental Status: He is alert and oriented to person, place, and time.  Psychiatric:  Mood and Affect: Mood normal.        Behavior: Behavior normal.     Temp 98.9 F (37.2 C)   Ht 5\' 10"  (1.778 m)   Wt 199 lb 6.4 oz (90.4 kg)   BMI 28.61 kg/m  Wt Readings from Last 3 Encounters:  02/12/20 199 lb 6.4 oz (90.4 kg)  01/13/20 193 lb 12.8 oz  (87.9 kg)  01/09/20 194 lb (88 kg)   Pulse Readings from Last 3 Encounters:  01/13/20 85  01/09/20 (!) 109  12/24/19 (!) 130    BP Readings from Last 3 Encounters:  01/13/20 124/76  01/09/20 118/80  12/24/19 124/70    Lab Results  Component Value Date   CHOL 184 02/12/2020   HDL 46.50 02/12/2020   LDLDIRECT 99.0 02/12/2020   TRIG 227.0 (H) 02/12/2020   CHOLHDL 4 02/12/2020      Health Maintenance Due  Topic Date Due  . TETANUS/TDAP  Never done  . INFLUENZA VACCINE  Never done    There are no preventive care reminders to display for this patient.  Lab Results  Component Value Date   TSH 2.52 02/12/2020   Lab Results  Component Value Date   WBC 7.1 02/12/2020   HGB 17.3 (H) 02/12/2020   HCT 49.7 02/12/2020   MCV 85.9 02/12/2020   PLT 313.0 02/12/2020   Lab Results  Component Value Date   NA 138 02/12/2020   K 4.4 02/12/2020   CO2 31 02/12/2020   GLUCOSE 80 02/12/2020   BUN 8 02/12/2020   CREATININE 1.03 02/12/2020   BILITOT 0.6 02/12/2020   ALKPHOS 89 02/12/2020   AST 26 02/12/2020   ALT 53 02/12/2020   PROT 7.1 02/12/2020   ALBUMIN 4.5 02/12/2020   CALCIUM 9.3 02/12/2020   GFR 84.15 02/12/2020   Lab Results  Component Value Date   CHOL 184 02/12/2020   Lab Results  Component Value Date   HDL 46.50 02/12/2020   No results found for: [facility] Lab Results  Component Value Date   TRIG 227.0 (H) 02/12/2020   Lab Results  Component Value Date   CHOLHDL 4 02/12/2020   Lab Results  Component Value Date   HGBA1C 4.9 02/12/2020      Assessment & Plan:   Problem List Items Addressed This Visit      Other   Pain in both testicles   Relevant Orders   Ambulatory referral to Urology   Urinalysis, Routine w reflex microscopic (Completed)   Urine Culture (Completed)   Urine cytology ancillary only   HIV Antibody (routine testing w rflx) (Completed)   Abnormal ejaculation   Relevant Orders   Ambulatory referral to Urology   Urinalysis,  Routine w reflex microscopic (Completed)   Urine Culture (Completed)   Urine cytology ancillary only   Anal or rectal pain   Relevant Orders   Ambulatory referral to Urology   Encounter for medical examination to establish care - Primary   Relevant Orders   Comprehensive metabolic panel (Completed)   VITAMIN D 25 Hydroxy (Vit-D Deficiency, Fractures) (Completed)   Overweight with body mass index (BMI) of 28 to 28.9 in adult   Relevant Orders   CBC with Differential/Platelet (Completed)   TSH (Completed)   Hemoglobin A1c (Completed)   Lipid panel (Completed)   Attention deficit hyperactivity disorder (ADHD)   Relevant Orders   B12 and Folate Panel (Completed)   Dysuria     Please go to the lab today for routine CPE  labs.  Continue with your plans to see GI as arranged.  I will place a referral for urgent for urology to evaluate your testicular concerns.   No orders of the defined types were placed in this encounter.  02/15/2020: Laboratory addendum:  Urine results show on no bacterial urinary tract infection.  He is positive for trace hemoglobin in the urine and mucus and needs to see Urology as arranged for next Tuesday.  Advised to hydrate well at this time.    Vitamin D is low: This is mildly low.  With history of nephrolithiasis and urology consult in process, would advise vitamin D3 by mouth at 400 IU daily.  The hemoglobin was slightly elevated at 17.3.  Would advise hydration and monitoring.  The lipids reveal elevated triglycerides and advise a diet with less sugar processed foods, and fast foods if he eats out. Consume lean meat and vegetable protein, nonfat to low-fat dairy, vegetables, fruits, complex carbohydrates with aerobic exercise  goal of 30 min x 5 times a week and add in twice weekly weight resistance training.    Follow-up: Return in about 3 months (around 05/13/2020).   This visit occurred during the SARS-CoV-2 public health emergency.  Safety protocols  were in place, including screening questions prior to the visit, additional usage of staff PPE, and extensive cleaning of exam room while observing appropriate contact time as indicated for disinfecting solutions.    Amedeo KinsmanKimberly Wafa Martes, NP

## 2020-02-12 NOTE — Patient Instructions (Addendum)
Please go to the lab today for routine CPE labs.  Continue with your plans to see GI as arranged.  I will place a referral for urgent for urology to evaluate your testicular concerns.   Proctitis  Proctitis is swelling and soreness (inflammation) of the lining of the rectum. The rectum is at the end of the large intestine, and it leads to the anus. The inflammation causes pain and discomfort. It may be short-term and sudden (acute) or a long-lasting (chronic) problem. What are the causes? This condition may be caused by:  STDs (sexually transmitted diseases).  Infection.  Trauma or injury to the anus or rectum.  Ulcerative colitis or Crohn's disease.  Radiation therapy that is directed near the rectum.  Antibiotic therapy. What are the signs or symptoms? Symptoms of this condition include:  Sudden, uncomfortable, and frequent urges to have a bowel movement.  Anal pain or rectal pain.  Pain or cramping in the abdomen.  A sensation that the rectum is full.  Rectal bleeding.  Pus or mucus discharge from the anus.  Diarrhea or frequent soft, loose stools (feces).  Constipation.  Pain with bowel movements. How is this diagnosed? This condition may be diagnosed based on:  A medical history and physical exam.  Various tests, such as: ? An STD test. ? Blood tests. ? Stool tests. ? Rectal culture. ? A procedure to evaluate the anal canal (anoscopy). ? Procedures to look at the entire large bowel or part of it (colonoscopy or sigmoidoscopy). How is this treated? Treatment for this condition depends on the cause. The main goals of treatment are to reduce the symptoms of inflammation and to get rid of any infection. Treatment may include:  Home remedies and lifestyle changes. These may include: ? Sitz baths. A sitz bath can be used to help relieve symptoms, clean, and promote healing in the genital and anal areas. ? Avoiding food right before bedtime.  Medicines,  such as: ? Corticosteroid or anti-inflammatory medicines. These include topical ointments, foams, suppositories, or enemas. ? Antibiotic medicines to treat infection or to control harmful bacteria. Antiviral medicines may also be used. ? Medicines to control diarrhea, soften stools, and reduce pain. ? Medicines to suppress the immune system.  Nutritional, dietary, or herbal supplements.  Avoiding the activity that caused rectal trauma.  Heat or laser therapy for persistent bleeding.  A dilation procedure to enlarge a narrowed rectum.  Surgery to repair the damaged rectal lining. This is rare. If your proctitis was caused by an STD, your health care provider may test you for infection again 3 months after treatment. Follow these instructions at home:  Medicines  Take over-the-counter and prescription medicines only as told by your health care provider.  If you were prescribed an antibiotic medicine, take it as told by your health care provider. Do not stop using the antibiotic even if you start to feel better. General instructions  Take sitz baths as told by your health care provider. A sitz bath is a shallow, warm water bath that is taken while you are sitting down. The water should only come up to your hips and should cover your buttocks.  Try to avoid eating right before bedtime.  You may need to take actions to prevent or treat constipation, such as: ? Drink enough fluid to keep your urine pale yellow. ? Take over-the-counter or prescription medicines. ? Eat foods that are high in fiber, such as beans, whole grains, and fresh fruits and vegetables. ? Limit  foods that are high in fat and processed sugars, such as fried or sweet foods.  Keep all follow-up visits as told by your health care provider. This is important. Contact a health care provider if:  Your symptoms do not improve with treatment.  Your symptoms get worse.  You have a fever. Get help right away if  you:  Have blood in your stool or blood coming from the rectal area. Summary  Proctitis is swelling and soreness (inflammation) of the lining of the rectum.  Inflammation from proctitis causes pain and discomfort.  This condition may be caused by STDs, infection, trauma, ulcerative colitis, radiation therapy, or antibiotic therapy.  Proctitis may be treated with sitz baths, medicines, heat or laser therapy, or dilation. Rarely, this condition may be treated with surgery. This information is not intended to replace advice given to you by your health care provider. Make sure you discuss any questions you have with your health care provider. Document Revised: 01/02/2018 Document Reviewed: 01/02/2018 Elsevier Patient Education  2020 [employer]  Dysuria Dysuria is pain or discomfort while urinating. The pain or discomfort may be felt in the part of your body that drains urine from the bladder (urethra) or in the surrounding tissue of the genitals. The pain may also be felt in the groin area, lower abdomen, or lower back. You may have to urinate frequently or have the sudden feeling that you have to urinate (urgency). Dysuria can affect both men and women, but it is more common in women. Dysuria can be caused by many different things, including:  Urinary tract infection.  Kidney stones or bladder stones.  Certain sexually transmitted infections (STIs), such as chlamydia.  Dehydration.  Inflammation of the tissues of the vagina.  Use of certain medicines.  Use of certain soaps or scented products that cause irritation. Follow these instructions at home: General instructions  Watch your condition for any changes.  Urinate often. Avoid holding urine for long periods of time.  After a bowel movement or urination, women should cleanse from front to back, using each tissue only once.  Urinate after sexual intercourse.  Keep all follow-up visits as told by your health care  provider. This is important.  If you had any tests done to find the cause of dysuria, it is up to you to get your test results. Ask your health care provider, or the department that is doing the test, when your results will be ready. Eating and drinking   Drink enough fluid to keep your urine pale yellow.  Avoid caffeine, tea, and alcohol. They can irritate the bladder and make dysuria worse. In men, alcohol may irritate the prostate. Medicines  Take over-the-counter and prescription medicines only as told by your health care provider.  If you were prescribed an antibiotic medicine, take it as told by your health care provider. Do not stop taking the antibiotic even if you start to feel better. Contact a health care provider if:  You have a fever.  You develop pain in your back or sides.  You have nausea or vomiting.  You have blood in your urine.  You are not urinating as often as you usually do. Get help right away if:  Your pain is severe and not relieved with medicines.  You cannot eat or drink without vomiting.  You are confused.  You have a rapid heartbeat while at rest.  You have shaking or chills.  You feel extremely weak. Summary  Dysuria is pain or  discomfort while urinating. Many different conditions can lead to dysuria.  If you have dysuria, you may have to urinate frequently or have the sudden feeling that you have to urinate (urgency).  Watch your condition for any changes. Keep all follow-up visits as told by your health care provider.  Make sure that you urinate often and drink enough fluid to keep your urine pale yellow. This information is not intended to replace advice given to you by your health care provider. Make sure you discuss any questions you have with your health care provider. Document Revised: 05/04/2017 Document Reviewed: 03/08/2017 Elsevier Patient Education  2020 [employer].

## 2020-02-13 LAB — COMPREHENSIVE METABOLIC PANEL
ALT: 53 U/L (ref 0–53)
AST: 26 U/L (ref 0–37)
Albumin: 4.5 g/dL (ref 3.5–5.2)
Alkaline Phosphatase: 89 U/L (ref 39–117)
BUN: 8 mg/dL (ref 6–23)
CO2: 31 mEq/L (ref 19–32)
Calcium: 9.3 mg/dL (ref 8.4–10.5)
Chloride: 100 mEq/L (ref 96–112)
Creatinine, Ser: 1.03 mg/dL (ref 0.40–1.50)
GFR: 84.15 mL/min (ref >60.00)
Glucose, Bld: 80 mg/dL (ref 70–99)
Potassium: 4.4 mEq/L (ref 3.5–5.1)
Sodium: 138 mEq/L (ref 135–145)
Total Bilirubin: 0.6 mg/dL (ref 0.2–1.2)
Total Protein: 7.1 g/dL (ref 6.0–8.3)

## 2020-02-13 LAB — CBC WITH DIFFERENTIAL/PLATELET
Basophils Absolute: 0 10*3/uL (ref 0.0–0.1)
Basophils Relative: 0.6 % (ref 0.0–3.0)
Eosinophils Absolute: 0.1 10*3/uL (ref 0.0–0.7)
Eosinophils Relative: 2 % (ref 0.0–5.0)
HCT: 49.7 % (ref 39.0–52.0)
Hemoglobin: 17.3 g/dL — ABNORMAL HIGH (ref 13.0–17.0)
Lymphocytes Relative: 35.9 % (ref 12.0–46.0)
Lymphs Abs: 2.5 10*3/uL (ref 0.7–4.0)
MCHC: 34.7 g/dL (ref 30.0–36.0)
MCV: 85.9 fl (ref 78.0–100.0)
Monocytes Absolute: 0.3 10*3/uL (ref 0.1–1.0)
Monocytes Relative: 4.9 % (ref 3.0–12.0)
Neutro Abs: 4 10*3/uL (ref 1.4–7.7)
Neutrophils Relative %: 56.6 % (ref 43.0–77.0)
Platelets: 313 10*3/uL (ref 150.0–400.0)
RBC: 5.79 Mil/uL (ref 4.22–5.81)
RDW: 12.1 % (ref 11.5–15.5)
WBC: 7.1 10*3/uL (ref 4.0–10.5)

## 2020-02-13 LAB — URINALYSIS, ROUTINE W REFLEX MICROSCOPIC
Bilirubin Urine: NEGATIVE
Leukocytes,Ua: NEGATIVE
Nitrite: NEGATIVE
Specific Gravity, Urine: 1.02 (ref 1.000–1.030)
Urine Glucose: NEGATIVE
Urobilinogen, UA: 0.2 (ref 0.0–1.0)
pH: 7 (ref 5.0–8.0)

## 2020-02-13 LAB — B12 AND FOLATE PANEL
Folate: 12.7 ng/mL (ref >5.9)
Vitamin B-12: 229 pg/mL (ref 211–911)

## 2020-02-13 LAB — LIPID PANEL
Cholesterol: 184 mg/dL (ref 0–200)
HDL: 46.5 mg/dL (ref >39.00)
NonHDL: 137.77
Total CHOL/HDL Ratio: 4
Triglycerides: 227 mg/dL — ABNORMAL HIGH (ref 0.0–149.0)
VLDL: 45.4 mg/dL — ABNORMAL HIGH (ref 0.0–40.0)

## 2020-02-13 LAB — URINE CULTURE
MICRO NUMBER:: 10929817
Result:: NO GROWTH
SPECIMEN QUALITY:: ADEQUATE

## 2020-02-13 LAB — LDL CHOLESTEROL, DIRECT: Direct LDL: 99 mg/dL

## 2020-02-13 LAB — TSH: TSH: 2.52 u[IU]/mL (ref 0.35–4.50)

## 2020-02-13 LAB — HEMOGLOBIN A1C: Hgb A1c MFr Bld: 4.9 % (ref 4.6–6.5)

## 2020-02-13 LAB — VITAMIN D 25 HYDROXY (VIT D DEFICIENCY, FRACTURES): VITD: 25.68 ng/mL — ABNORMAL LOW (ref 30.00–100.00)

## 2020-02-13 LAB — HIV ANTIBODY (ROUTINE TESTING W REFLEX): HIV 1&2 Ab, 4th Generation: NONREACTIVE

## 2020-02-15 ENCOUNTER — Encounter: Payer: Self-pay | Admitting: Nurse Practitioner

## 2020-02-16 LAB — URINE CYTOLOGY ANCILLARY ONLY
Chlamydia: NEGATIVE
Comment: NEGATIVE
Comment: NEGATIVE
Comment: NORMAL
Neisseria Gonorrhea: NEGATIVE
Trichomonas: NEGATIVE

## 2020-02-16 NOTE — Progress Notes (Signed)
Done

## 2020-02-17 ENCOUNTER — Encounter: Payer: Self-pay | Admitting: Gastroenterology

## 2020-02-17 ENCOUNTER — Other Ambulatory Visit: Payer: Self-pay | Admitting: *Deleted

## 2020-02-17 ENCOUNTER — Other Ambulatory Visit: Payer: Self-pay

## 2020-02-17 ENCOUNTER — Ambulatory Visit: Payer: BLUE CROSS/BLUE SHIELD | Admitting: Gastroenterology

## 2020-02-17 ENCOUNTER — Ambulatory Visit: Payer: BLUE CROSS/BLUE SHIELD | Admitting: Urology

## 2020-02-17 ENCOUNTER — Encounter: Payer: Self-pay | Admitting: Urology

## 2020-02-17 ENCOUNTER — Other Ambulatory Visit
Admission: RE | Admit: 2020-02-17 | Discharge: 2020-02-17 | Disposition: A | Discharge status: Home / Self Care | Payer: BLUE CROSS/BLUE SHIELD | Attending: Urology | Admitting: Urology

## 2020-02-17 VITALS — BP 104/67 | HR 105 | Ht 70.0 in | Wt 201.0 lb

## 2020-02-17 VITALS — BP 127/84 | HR 105 | Ht 70.0 in | Wt 200.0 lb

## 2020-02-17 DIAGNOSIS — N5082 Scrotal pain: Secondary | ICD-10-CM | POA: Diagnosis not present

## 2020-02-17 DIAGNOSIS — K6289 Other specified diseases of anus and rectum: Secondary | ICD-10-CM | POA: Diagnosis not present

## 2020-02-17 DIAGNOSIS — N50811 Right testicular pain: Secondary | ICD-10-CM | POA: Diagnosis present

## 2020-02-17 DIAGNOSIS — N50812 Left testicular pain: Secondary | ICD-10-CM | POA: Insufficient documentation

## 2020-02-17 DIAGNOSIS — K219 Gastro-esophageal reflux disease without esophagitis: Secondary | ICD-10-CM | POA: Diagnosis not present

## 2020-02-17 LAB — URINALYSIS, COMPLETE (UACMP) WITH MICROSCOPIC
Glucose, UA: NEGATIVE mg/dL
Ketones, ur: 15 mg/dL — AB
Leukocytes,Ua: NEGATIVE
Nitrite: NEGATIVE
Protein, ur: NEGATIVE mg/dL
Specific Gravity, Urine: 1.015 (ref 1.005–1.030)
pH: 6 (ref 5.0–8.0)

## 2020-02-17 MED ORDER — HYDROCORTISONE ACETATE 25 MG RE SUPP: 25.0000 mg | Freq: Two times a day (BID) | RECTAL | 0 refills | Status: DC

## 2020-02-17 MED ORDER — CELECOXIB 200 MG PO CAPS: 200.0000 mg | ORAL_CAPSULE | Freq: Every day | ORAL | 0 refills | Status: DC

## 2020-02-17 NOTE — Progress Notes (Signed)
Gastroenterology Consultation  Referring Provider:     Wendy Douglas* Primary Care Physician:  Bradley Nan, NP Primary Gastroenterologist:  Dr. Servando Douglas     Reason for Consultation:     Hematemesis        HPI:   Bradley Douglas is a 29 y.o. y/o male referred for consultation & management of Hematemesis by Dr. Arvilla Douglas, Bradley Plumber, NP.  This patient comes in today with a history of hematemesis versus hemoptysis.  The patient was seen by pulmonology who put him on a PPI and he states that his chest discomfort and returning had resolved and so had his upper respiratory symptoms and vomiting blood.  The patient does report that he has been doing well on his PPI without any side effects.  He feels that he is doing much better.  The patient does report that he has rectal pain with some pink material when he wipes.  The patient states that the rectal pain is more prevalent when he has a bowel movement.  He was not sure if he had a hemorrhoid causing the problems but has been going on with the symptoms for about 2 years.  The patient states that his symptoms got slightly better when he started taking Metamucil.  The patient denies any family history of colon cancer colon polyp.  He also denies any unexplained weight loss.  Past Medical History:  Diagnosis Date  . ADHD 2015  . Anxiety   . Chronic otitis media of both ears    childhood  . Depression    on treatment during '02-2012  . GERD (gastroesophageal reflux disease)   . Nephrolithiasis   . Seasonal allergic rhinitis     Past Surgical History:  Procedure Laterality Date  . WISDOM TOOTH EXTRACTION      Prior to Admission medications   Medication Sig Start Date End Date Taking? Authorizing Provider  buPROPion (WELLBUTRIN XL) 300 MG 24 hr tablet Take 300 mg by mouth daily. 12/05/19  Yes [provider]  celecoxib (CELEBREX) 200 MG capsule Take 1 capsule (200 mg total) by mouth daily. 02/17/20  Yes Bradley Come, MD    esomeprazole (NEXIUM) 40 MG capsule Take 1 capsule (40 mg total) by mouth daily. 12/24/19 03/23/20 Yes Bradley Saner, MD  lisdexamfetamine (VYVANSE) 60 MG capsule Take 1 capsule by mouth daily.   Yes [provider]  metoprolol succinate (TOPROL-XL) 50 MG 24 hr tablet Take 0.5 tablets (25 mg total) by mouth daily. Take with or immediately following a meal. 01/09/20 04/08/20 Yes Agbor-Etang, Arlys John, MD  hydrocortisone (ANUSOL-HC) 25 MG suppository Place 1 suppository (25 mg total) rectally 2 (two) times daily. 02/17/20   Bradley Minium, MD    Family History  Problem Relation Age of Onset  . ADD / ADHD Mother   . Mitral valve prolapse Mother   . Cancer Maternal Grandfather        leukemia  . Hypertension Other        paternal side  . Cancer Maternal Grandmother   . Heart disease Neg Hx   . Diabetes Neg Hx   . Stroke Neg Hx      Social History   Tobacco Use  . Smoking status: Never Smoker  . Smokeless tobacco: Never Used  Vaping Use  . Vaping Use: Never used  Substance Use Topics  . Alcohol use: Yes    Alcohol/week: 9.0 standard drinks    Types: 3 Glasses of wine, 3  Cans of beer, 3 Shots of liquor per week    Comment: 3x week  . Drug use: No    Allergies as of 02/17/2020 - Review Complete 02/17/2020  Allergen Reaction Noted  . Amoxicillin Swelling 01/19/2014    Review of Systems:    All systems reviewed and negative except where noted in HPI.   Physical Exam:  BP 104/67   Pulse (!) 105   Ht 5\' 10"  (1.778 m)   Wt 201 lb (91.2 kg)   BMI 28.84 kg/m  No LMP for male patient. General:   Alert,  Well-developed, well-nourished, pleasant and cooperative in NAD Head:  Normocephalic and atraumatic. Eyes:  Sclera clear, no icterus.   Conjunctiva pink. Ears:  Normal auditory acuity. Neck:  Supple; no masses or thyromegaly. Lungs:  Respirations even and unlabored.  Clear throughout to auscultation.   No wheezes, crackles, or rhonchi. No acute distress. Heart:  Regular  rate and rhythm; no murmurs, clicks, rubs, or gallops. Abdomen:  Normal bowel sounds.  No bruits.  Soft, non-tender and non-distended without masses, hepatosplenomegaly or hernias noted.  No guarding or rebound tenderness.  Negative Carnett sign.   Rectal:  Anal fissure with tenderness at 12:00  Pulses:  Normal pulses noted. Extremities:  No clubbing or edema.  No cyanosis. Neurologic:  Alert and oriented x3;  grossly normal neurologically. Skin:  Intact without significant lesions or rashes.  No jaundice. Lymph Nodes:  No significant cervical adenopathy. Psych:  Alert and cooperative. Normal mood and affect.  Imaging Studies: No results found.  Assessment and Plan:   Bradley Douglas is a 29 y.o. y/o male who comes in with a history of chest discomfort and what sounds like hematemesis. All the patient's symptoms have completely resolved on a PPI.  The patient will continue the PPI. If the PPI stopped working the patient has been told that he will then need a upper endoscopy.  The patient has rectal pain with an anal fissure found on rectal exam.  The patient has been started on Anusol suppositories and has been told to add MiraLAX to his fiber so that he will have soft stools and try to let the area heal.  The patient has been told that if it does not heal that he may need surgical intervention with a anal myotomy.  The patient has been explained the plan and agrees with it.   36, MD. Bradley Douglas    Note: This dictation was prepared with Dragon dictation along with smaller phrase technology. Any transcriptional errors that result from this process are unintentional.

## 2020-02-17 NOTE — Progress Notes (Signed)
02/17/20 12:58 PM   APOLLO TIMOTHY 1990-09-18 [id number]  CC: Scrotal/perineal pain, microscopic hematuria  HPI: I saw Mr. Turman in urology clinic for the above issues.  He is a 29 year old male with a history of anxiety and depression who presents with 2 years of intermittent scrotal and perineal pain.  The left testicle is more bothersome than the right.  He also will sometimes have penile pain or pain behind the scrotum.  He complains of a solid ejaculate.  There are no aggravating or alleviating factors.  He has never tried any medications previously.  He was recently seen by his PCP and urine culture was negative, however there were 3-6 RBCs seen.  He denies any gross hematuria.  He has mild urinary symptoms of some dribbling post voiding, but denies any incontinence.  He has a distant history of STD, but no history of UTI or urinary retention.  No prior imaging to review.  Urinalysis today benign with 0-5 WBCs, 0-5 RBCs, no bacteria, nitrite negative  PMH: Past Medical History:  Diagnosis Date  . ADHD 2015  . Anxiety   . Chronic otitis media of both ears    childhood  . Depression    on treatment during '02-2012  . GERD (gastroesophageal reflux disease)   . Nephrolithiasis   . Seasonal allergic rhinitis     Surgical History: Past Surgical History:  Procedure Laterality Date  . WISDOM TOOTH EXTRACTION      Family History: Family History  Problem Relation Age of Onset  . ADD / ADHD Mother   . Mitral valve prolapse Mother   . Cancer Maternal Grandfather        leukemia  . Hypertension Other        paternal side  . Cancer Maternal Grandmother   . Heart disease Neg Hx   . Diabetes Neg Hx   . Stroke Neg Hx     Social History:  reports that he has never smoked. He has never used smokeless tobacco. He reports current alcohol use of about 9.0 standard drinks of alcohol per week. He reports that he does not use drugs.  Physical Exam: BP 127/84   Pulse (!) 105    Ht 5\' 10"  (1.778 m)   Wt 200 lb (90.7 kg)   BMI 28.70 kg/m    Constitutional:  Alert and oriented, No acute distress. Cardiovascular: No clubbing, cyanosis, or edema. Respiratory: Normal respiratory effort, no increased work of breathing. GI: Abdomen is soft, nontender, nondistended, no abdominal masses GU: Phallus with widely patent meatus, no lesions, testicles 20 cc and descended bilaterally without masses, no tenderness noted, no perineal or penile masses  Laboratory Data: Reviewed see HPI  Pertinent Imaging: None to review  Assessment & Plan:   In summary, he is a 29 year old male with anxiety and depression whose primary complaint today is intermittent scrotal and perineal pain of unclear etiology.  There is no prior imaging to review.  He had one episode of microscopic hematuria, but that has resolved on urinalysis today.  We discussed the different risk categories of microscopic hematuria, and he falls into the low risk category.  He would like to defer cystoscopy, which is very reasonable in the setting of his repeat urinalysis today that is negative.  Regarding his scrotal and perineal pain, I recommended a scrotal ultrasound, we discussed behavioral strategies at length including snug fitting underwear, and warm tub baths, as well as a trial of Celebrex x2 weeks.  Scrotal ultrasound, call  with results Celebrex daily x2 weeks for scrotal pain Virtual visit 1 month symptom check Consider amitriptyline in the future if persistent pain  Legrand Rams, MD 02/17/2020  [facility] [address], [address] [place], [place] [address] [phone]

## 2020-02-17 NOTE — Patient Instructions (Signed)
Pelvic Pain, Male Pelvic pain is pain in your lower abdomen, below your belly button and between your hips. The pain may start suddenly (be acute), keep coming back (recur), or last a long time (become chronic). Pelvic pain that lasts longer than six months is considered chronic. There are many possible causes of pelvic pain. Sometimes, the cause is not known. Pelvic pain may affect your:  Prostate gland.  Urinary system.  Digestive tract.  Musculoskeletal system. Strained muscles or ligaments may cause pelvic pain. Follow these instructions at home:  Medicines  Take over-the-counter and prescription medicines only as told by your health care provider.  If you were prescribed an antibiotic medicine, take it as told by your health care provider. Do not stop taking the antibiotic even if you start to feel better. Managing pain, stiffness, and swelling   Take warm water baths (sitz baths). Sitz baths help with relaxing your pelvic floor muscles. ? For a sitz bath, the water only comes up to your hips and covers your buttocks. A sitz bath may done at home in a bathtub or with a portable sitz bath that fits over the toilet.  If directed, apply heat to the affected area before you exercise. Use the heat source that your health care provider recommends, such as a moist heat pack or a heating pad. ? Place a towel between your skin and the heat source. ? Leave the heat on for 20-30 minutes. ? Remove the heat if your skin turns bright red. This is especially important if you are unable to feel pain, heat, or cold. You may have a greater risk of getting burned. General instructions  Rest as told by your health care provider.  Keep a journal of your pelvic pain. Write down: ? When the pain started. ? Where the pain is located. ? What seems to make the pain better or worse. ? Any symptoms you have along with the pain.  Follow your treatment plan as told by your health care provider. This may  include: ? Pelvic physical therapy. ? Yoga, meditation, and exercise. ? Biofeedback. This process trains you to manage your body's response (physiological response) through breathing techniques and relaxation methods. You will work with a therapist while machines are used to monitor your physical symptoms. ? Acupuncture. This is a type of treatment that involves stimulating specific points on your body by inserting thin needles through your skin to treat pain.  Keep all follow-up visits as told by your health care provider. This is important. Contact a health care provider if:  Medicine does not help your pain.  Your pain comes back.  You have new symptoms.  You have a fever or chills.  You are constipated.  You have blood in your urine or stool.  You feel weak or light-headed. Get help right away if:  You have sudden severe pain.  Your pain steadily gets worse.  You have severe pain along with fever, nausea, vomiting, or excessive sweating. Summary  Pelvic pain is pain in your lower abdomen, below your belly button and between your hips. There are many possible causes of pelvic pain. Sometimes, the cause is not known.  Take over-the-counter and prescription medicines only as told by your health care provider. If you were prescribed an antibiotic medicine, take it as told by your health care provider. Do not stop taking the antibiotic even if you start to feel better.  Contact a health care provider if you have new or worsening symptoms.    Get help right away if you have severe pain along with fever, nausea, vomiting, or excessive sweating.  Keep all follow-up visits as told by your health care provider. This is important. This information is not intended to replace advice given to you by your health care provider. Make sure you discuss any questions you have with your health care provider. Document Revised: 10/10/2017 Document Reviewed: 10/10/2017 Elsevier Patient Education   2020 [employer]  

## 2020-02-20 ENCOUNTER — Ambulatory Visit: Payer: BLUE CROSS/BLUE SHIELD | Admitting: Cardiology

## 2020-02-20 ENCOUNTER — Encounter: Payer: Self-pay | Admitting: Cardiology

## 2020-02-20 ENCOUNTER — Other Ambulatory Visit: Payer: Self-pay

## 2020-02-20 VITALS — BP 114/80 | HR 90 | Ht 70.0 in | Wt 200.0 lb

## 2020-02-20 DIAGNOSIS — R000 Tachycardia, unspecified: Secondary | ICD-10-CM

## 2020-02-20 DIAGNOSIS — K2100 Gastro-esophageal reflux disease with esophagitis, without bleeding: Secondary | ICD-10-CM | POA: Diagnosis not present

## 2020-02-20 NOTE — Patient Instructions (Signed)

## 2020-02-20 NOTE — Progress Notes (Signed)
Cardiology Office Note:    Date:  02/20/2020   ID:  Bradley Douglas, DOB 01/18/1991, MRN [id number]  PCP:  Theadore Nan, NP  [facility] Cardiologist:  Debbe Odea, MD  [facility] Electrophysiologist:  None   Referring MD: Jac Canavan, PA-C   Chief Complaint  Patient presents with  . Follow-up    6 week follow up. Medications verbally reviewed with patient.      History of Present Illness:    Bradley Douglas is a 29 y.o. male with a hx of anxiety, depression, ADHD ,GERD who presents for follow-up.  He was last seen due to tachycardia for over a year.  Tachycardia was deemed likely from taking Vyvanse for ADHD.  He was started on Toprol-XL 25 mg daily.  He states his symptoms have improved since starting Toprol-XL.  Has no adverse effects with the medications.  Current heart rates are ranging in the 90s which is much better from prior.  Has no concerns at this time.  Prior notes Echocardiogram on 01/13/2020 showed normal systolic function, EF 50 to 55%.  Past Medical History:  Diagnosis Date  . ADHD 2015  . Anxiety   . Chronic otitis media of both ears    childhood  . Depression    on treatment during '02-2012  . GERD (gastroesophageal reflux disease)   . Nephrolithiasis   . Seasonal allergic rhinitis     Past Surgical History:  Procedure Laterality Date  . WISDOM TOOTH EXTRACTION      Current Medications: Current Meds  Medication Sig  . buPROPion (WELLBUTRIN XL) 300 MG 24 hr tablet Take 300 mg by mouth daily.  . celecoxib (CELEBREX) 200 MG capsule Take 1 capsule (200 mg total) by mouth daily.  Marland Kitchen esomeprazole (NEXIUM) 40 MG capsule Take 1 capsule (40 mg total) by mouth daily.  . hydrocortisone (ANUSOL-HC) 25 MG suppository Place 1 suppository (25 mg total) rectally 2 (two) times daily.  Marland Kitchen lisdexamfetamine (VYVANSE) 60 MG capsule Take 1 capsule by mouth daily.  . metoprolol succinate (TOPROL-XL) 50 MG 24 hr tablet Take 0.5 tablets (25 mg  total) by mouth daily. Take with or immediately following a meal.     Allergies:   Amoxicillin   Social History   Socioeconomic History  . Marital status: Single    Spouse name: Not on file  . Number of children: Not on file  . Years of education: Not on file  . Highest education level: Not on file  Occupational History  . Occupation: Multimedia programmer   Tobacco Use  . Smoking status: Never Smoker  . Smokeless tobacco: Never Used  Vaping Use  . Vaping Use: Never used  Substance and Sexual Activity  . Alcohol use: Yes    Alcohol/week: 9.0 standard drinks    Types: 3 Glasses of wine, 3 Cans of beer, 3 Shots of liquor per week    Comment: 3x week  . Drug use: No  . Sexual activity: Yes    Birth control/protection: Condom    Comment: Sexual partners: male   Other Topics Concern  . Not on file  Social History Narrative   He works as a Multimedia programmer.       Social Determinants of Health   Financial Resource Strain:   . Difficulty of Paying Living Expenses: Not on file  Food Insecurity:   . Worried About Programme researcher, broadcasting/film/video in the Last Year: Not on file  . Ran Out of Food in  the Last Year: Not on file  Transportation Needs:   . Lack of Transportation (Medical): Not on file  . Lack of Transportation (Non-Medical): Not on file  Physical Activity:   . Days of Exercise per Week: Not on file  . Minutes of Exercise per Session: Not on file  Stress:   . Feeling of Stress : Not on file  Social Connections:   . Frequency of Communication with Friends and Family: Not on file  . Frequency of Social Gatherings with Friends and Family: Not on file  . Attends Religious Services: Not on file  . Active Member of Clubs or Organizations: Not on file  . Attends Banker Meetings: Not on file  . Marital Status: Not on file     Family History: The patient's family history includes ADD / ADHD in his mother; Cancer in his maternal grandfather and maternal grandmother;  Hypertension in an other family member; Mitral valve prolapse in his mother. There is no history of Heart disease, Diabetes, or Stroke.  ROS:   Please see the history of present illness.     All other systems reviewed and are negative.  EKGs/Labs/Other Studies Reviewed:    The following studies were reviewed today:   EKG:  EKG not ordered today.  Recent Labs: 02/12/2020: ALT 53; BUN 8; Creatinine, Ser 1.03; Hemoglobin 17.3; Platelets 313.0; Potassium 4.4; Sodium 138; TSH 2.52  Recent Lipid Panel    Component Value Date/Time   CHOL 184 02/12/2020 1621   TRIG 227.0 (H) 02/12/2020 1621   HDL 46.50 02/12/2020 1621   CHOLHDL 4 02/12/2020 1621   VLDL 45.4 (H) 02/12/2020 1621   LDLDIRECT 99.0 02/12/2020 1621    Physical Exam:    VS:  BP 114/80 (BP Location: Left Arm, Patient Position: Sitting, Cuff Size: Normal)   Pulse 90   Ht 5\' 10"  (1.778 m)   Wt 200 lb (90.7 kg)   SpO2 98%   BMI 28.70 kg/m     Wt Readings from Last 3 Encounters:  02/20/20 200 lb (90.7 kg)  02/17/20 201 lb (91.2 kg)  02/17/20 200 lb (90.7 kg)     GEN:  Well nourished, well developed in no acute distress HEENT: Normal NECK: No JVD; No carotid bruits LYMPHATICS: No lymphadenopathy CARDIAC: RRR , no murmurs, rubs, gallops RESPIRATORY:  Clear to auscultation without rales, wheezing or rhonchi  ABDOMEN: Soft, non-tender, non-distended MUSCULOSKELETAL:  No edema; No deformity  SKIN: Warm and dry NEUROLOGIC:  Alert and oriented x 3 PSYCHIATRIC:  Normal affect   ASSESSMENT:    1. Sinus tachycardia   2. Gastroesophageal reflux disease with esophagitis without hemorrhage    PLAN:    In order of problems listed above:  1. Patient with history of sinus tachycardia secondary to medication/Vyvanse.  Last echocardiogram showed low normal systolic function, EF 50 to 55%.  Symptoms have improved with starting Toprol-XL 25 mg daily.  Continue Toprol-XL as prescribed.  If symptoms are to worsen, will increase  Toprol dosage.  If he is to stop his stimulants, we can consider titrating of Toprol-XL and see how patient does. 2. History of GERD, continue PPI.  Follow-up in 6 months  This note was generated in part or whole with voice recognition software. Voice recognition is usually quite accurate but there are transcription errors that can and very often do occur. I apologize for any typographical errors that were not detected and corrected.  Medication Adjustments/Labs and Tests Ordered: Current medicines are reviewed at  length with the patient today.  Concerns regarding medicines are outlined above.  No orders of the defined types were placed in this encounter.  No orders of the defined types were placed in this encounter.   Patient Instructions  Medication Instructions:  Your physician recommends that you continue on your current medications as directed. Please refer to the Current Medication list given to you today.  *If you need a refill on your cardiac medications before your next appointment, please call your pharmacy*   Lab Work: None Ordered If you have labs (blood work) drawn today and your tests are completely normal, you will receive your results only by: Marland Kitchen MyChart Message (if you have MyChart) OR . A paper copy in the mail If you have any lab test that is abnormal or we need to change your treatment, we will call you to review the results.   Testing/Procedures: None Ordered   Follow-Up: At [facility], you and your health needs are our priority.  As part of our continuing mission to provide you with exceptional heart care, we have created designated Provider Care Teams.  These Care Teams include your primary Cardiologist (physician) and Advanced Practice Providers (APPs -  Physician Assistants and Nurse Practitioners) who all work together to provide you with the care you need, when you need it.  We recommend signing up for the patient portal called "MyChart".  Sign up  information is provided on this After Visit Summary.  MyChart is used to connect with patients for Virtual Visits (Telemedicine).  Patients are able to view lab/test results, encounter notes, upcoming appointments, etc.  Non-urgent messages can be sent to your provider as well.   To learn more about what you can do with MyChart, go to [email or web address].    Your next appointment:   6 month(s)  The format for your next appointment:   In Person  Provider:   Debbe Odea, MD   Other Instructions      Signed, Debbe Odea, MD  02/20/2020 1:12 PM    [facility]

## 2020-03-05 ENCOUNTER — Other Ambulatory Visit: Payer: Self-pay

## 2020-03-05 ENCOUNTER — Ambulatory Visit
Admission: RE | Admit: 2020-03-05 | Discharge: 2020-03-05 | Disposition: A | Discharge status: Home / Self Care | Payer: BLUE CROSS/BLUE SHIELD | Source: Ambulatory Visit | Attending: Urology | Admitting: Urology

## 2020-03-05 DIAGNOSIS — N5082 Scrotal pain: Secondary | ICD-10-CM | POA: Diagnosis present

## 2020-03-09 ENCOUNTER — Telehealth: Payer: Self-pay

## 2020-03-09 NOTE — Telephone Encounter (Signed)
Called pt informed him of the information below. Pt gave verbal understanding.  

## 2020-03-09 NOTE — Telephone Encounter (Signed)
-----   Message from Sondra Come, MD sent at 03/09/2020  8:26 AM EDT ----- Scrotal US normal, keep follow up as scheduled  Legrand Rams, MD 03/09/2020

## 2020-03-13 ENCOUNTER — Other Ambulatory Visit: Payer: Self-pay | Admitting: Pulmonary Disease

## 2020-03-23 ENCOUNTER — Other Ambulatory Visit: Payer: Self-pay

## 2020-03-23 ENCOUNTER — Telehealth (INDEPENDENT_AMBULATORY_CARE_PROVIDER_SITE_OTHER): Payer: BLUE CROSS/BLUE SHIELD | Admitting: Urology

## 2020-03-23 DIAGNOSIS — R102 Pelvic and perineal pain: Secondary | ICD-10-CM

## 2020-03-23 NOTE — Progress Notes (Signed)
Virtual Visit via Telephone Note  I connected with Bradley Douglas on 03/23/20 at 11:00 AM EDT by telephone and verified that I am speaking with the correct person using two identifiers.   Patient location: Home Provider location: [facility]([place])   I discussed the limitations, risks, security and privacy concerns of performing an evaluation and management service by telephone and the availability of in person appointments. We discussed the impact of the COVID-19 pandemic on the healthcare system, and the importance of social distancing and reducing patient and provider exposure. I also discussed with the patient that there may be a patient responsible charge related to this service. The patient expressed understanding and agreed to proceed.  Reason for visit: Scrotal pain  History of Present Illness: I had phone follow-up with Bradley Douglas today regarding his history of scrotal and perineal pain.  He also had a single episode of low-grade microscopic hematuria that resolved on a repeat urinalysis.  He was having intermittent scrotal and perineal pain, and scrotal ultrasound was benign.  We tried a course of Celebrex x2 weeks, which is almost completely resolved his symptoms.  He denies any significant complaints today, and feels like he is doing well.  We again reviewed the complex nature of pelvic pain and treatment strategies in the future including NSAIDs, rest, snug fitting underwear, icing, warm sitz baths, and pelvic floor physical therapy.   Follow Up: Follow-up as needed   I discussed the assessment and treatment plan with the patient. The patient was provided an opportunity to ask questions and all were answered. The patient agreed with the plan and demonstrated an understanding of the instructions.   The patient was advised to call back or seek an in-person evaluation if the symptoms worsen or if the condition fails to improve as anticipated.  I provided 12  minutes of non-face-to-face time during this encounter.   Sondra Come, MD

## 2020-04-02 ENCOUNTER — Other Ambulatory Visit: Payer: Self-pay

## 2020-04-02 MED ORDER — METOPROLOL SUCCINATE ER 50 MG PO TB24: 25.0000 mg | ORAL_TABLET | Freq: Every day | ORAL | 4 refills | Status: DC

## 2020-05-13 ENCOUNTER — Ambulatory Visit: Payer: BLUE CROSS/BLUE SHIELD | Admitting: Nurse Practitioner

## 2020-06-30 ENCOUNTER — Other Ambulatory Visit: Payer: Self-pay

## 2020-06-30 MED ORDER — METOPROLOL SUCCINATE ER 50 MG PO TB24: 25.0000 mg | ORAL_TABLET | Freq: Every day | ORAL | 3 refills | Status: DC

## 2020-08-23 ENCOUNTER — Ambulatory Visit: Payer: BLUE CROSS/BLUE SHIELD | Admitting: Cardiology

## 2020-09-02 ENCOUNTER — Ambulatory Visit: Payer: BLUE CROSS/BLUE SHIELD | Admitting: Pulmonary Disease

## 2020-10-21 ENCOUNTER — Encounter: Payer: Self-pay | Admitting: Gastroenterology

## 2020-10-21 ENCOUNTER — Other Ambulatory Visit: Payer: Self-pay

## 2020-10-21 ENCOUNTER — Ambulatory Visit (INDEPENDENT_AMBULATORY_CARE_PROVIDER_SITE_OTHER): Payer: 59 | Admitting: Gastroenterology

## 2020-10-21 VITALS — BP 105/68 | HR 99 | Ht 70.0 in | Wt 218.6 lb

## 2020-10-21 DIAGNOSIS — K602 Anal fissure, unspecified: Secondary | ICD-10-CM | POA: Diagnosis not present

## 2020-10-21 DIAGNOSIS — K6289 Other specified diseases of anus and rectum: Secondary | ICD-10-CM | POA: Diagnosis not present

## 2020-10-21 DIAGNOSIS — L290 Pruritus ani: Secondary | ICD-10-CM

## 2020-10-21 MED ORDER — CLOTRIMAZOLE 1 % EX CREA: 1.0000 "application " | TOPICAL_CREAM | Freq: Two times a day (BID) | CUTANEOUS | 0 refills | Status: DC

## 2020-10-21 NOTE — Progress Notes (Signed)
Primary Care Physician: Theadore Nan, NP  Primary Gastroenterologist:  Dr. Midge Minium  Chief Complaint  Patient presents with  . Rectal Pain    Rectal bleeding     HPI: Bradley Douglas is a 29 y.o. male here with a history of rectal pain and a history of rectal bleeding.  The patient was found to have an anal fissure at his last visit.  The patient reports that he tried multiple different creams and topical steroids for his insatiable rectal itching.  He continues to report that he has pain when he moves his bowels consistent with his previously known anal fissure.  Past Medical History:  Diagnosis Date  . ADHD 2015  . Anxiety   . Chronic otitis media of both ears    childhood  . Depression    on treatment during '02-2012  . GERD (gastroesophageal reflux disease)   . Nephrolithiasis   . Seasonal allergic rhinitis     Current Outpatient Medications  Medication Sig Dispense Refill  . buPROPion (WELLBUTRIN XL) 300 MG 24 hr tablet Take 300 mg by mouth daily.    Marland Kitchen esomeprazole (NEXIUM) 40 MG capsule TAKE 1 CAPSULE BY MOUTH EVERY DAY (Patient taking differently: 20 mg.) 90 capsule 3  . clindamycin (CLEOCIN) 300 MG capsule Take 300 mg by mouth every 6 (six) hours.    . CONCERTA 36 MG CR tablet Take 36 mg by mouth daily.    . COTEMPLA XR-ODT 25.9 MG TBED Take 2 tablets by mouth daily.     No current facility-administered medications for this visit.    Allergies as of 10/21/2020 - Review Complete 10/21/2020  Allergen Reaction Noted  . Amoxicillin Swelling 01/19/2014    ROS:  General: Negative for anorexia, weight loss, fever, chills, fatigue, weakness. ENT: Negative for hoarseness, difficulty swallowing , nasal congestion. CV: Negative for chest pain, angina, palpitations, dyspnea on exertion, peripheral edema.  Respiratory: Negative for dyspnea at rest, dyspnea on exertion, cough, sputum, wheezing.  GI: See history of present illness. GU:  Negative for dysuria,  hematuria, urinary incontinence, urinary frequency, nocturnal urination.  Endo: Negative for unusual weight change.    Physical Examination:   Ht 5\' 10"  (1.778 m)   Wt 218 lb 9.6 oz (99.2 kg)   BMI 31.37 kg/m   General: Well-nourished, well-developed in no acute distress.  Eyes: No icterus. Conjunctivae pink. Rectal: Peril anal erythema with clear demarcations of the border with multiple superficial breaks in the skin on external exam and tenderness at 6:00 and 12:00. Extremities: No lower extremity edema. No clubbing or deformities. Neuro: Alert and oriented x 3.  Grossly intact. Skin: Warm and dry, no jaundice.   Psych: Alert and cooperative, normal mood and affect.  Labs:    Imaging Studies: No results found.  Assessment and Plan:   Bradley Douglas is a 30 y.o. 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  The rash appears to be fungal and the patient will be started on antifungal cream for 2 weeks.  The patient will also be sent to surgery for treatment of his anal fissures.  He has also been told to keep the area clean and dry and use baby wipes after moving his bowels.  The patient has been explained the plan agrees with it.     38, MD. Midge Minium    Note: This dictation was prepared with Dragon dictation along with smaller phrase technology. Any transcriptional errors  that result from this process are unintentional.

## 2020-10-26 ENCOUNTER — Other Ambulatory Visit: Payer: Self-pay

## 2020-10-26 DIAGNOSIS — K602 Anal fissure, unspecified: Secondary | ICD-10-CM

## 2020-11-02 ENCOUNTER — Ambulatory Visit (INDEPENDENT_AMBULATORY_CARE_PROVIDER_SITE_OTHER): Payer: 59 | Admitting: General Surgery

## 2020-11-02 ENCOUNTER — Encounter: Payer: Self-pay | Admitting: General Surgery

## 2020-11-02 ENCOUNTER — Other Ambulatory Visit: Payer: Self-pay

## 2020-11-02 VITALS — BP 124/83 | HR 99 | Temp 99.0°F | Ht 70.0 in | Wt 217.8 lb

## 2020-11-02 DIAGNOSIS — K602 Anal fissure, unspecified: Secondary | ICD-10-CM | POA: Diagnosis not present

## 2020-11-02 MED ORDER — FLUCONAZOLE 100 MG PO TABS: 100.0000 mg | ORAL_TABLET | Freq: Every day | ORAL | 0 refills | Status: DC

## 2020-11-02 MED ORDER — FLUCONAZOLE 100 MG PO TABS: 100.0000 mg | ORAL_TABLET | Freq: Every day | ORAL | 0 refills | Status: AC

## 2020-11-02 NOTE — Patient Instructions (Addendum)
Nifedipine cream will be called in to [facility].  [address] [place] [address] [phone]  Please pick up the Diflucan at your pharmacy. Please see your follow up appointment listed below.       Anal Fissure, Adult  An anal fissure is a small tear or crack in the tissue around the opening of the butt (anus). Bleeding from the tear or crack usually stops on its own within a few minutes. The bleeding may happen every time you poop (have a bowel movement) until the tear or crack heals. What are the causes? This condition is usually caused by passing a large or hard poop (stool). Other causes include:  Trouble pooping (constipation).  Passing watery poop (diarrhea).  Inflammatory bowel disease (Crohn's disease or ulcerative colitis).  Childbirth.  Infections.  Anal sex. What are the signs or symptoms? Symptoms of this condition include:  Bleeding from the butt.  Small amounts of blood on your poop. The blood coats the outside of the poop. It is not mixed with the poop.  Small amounts of blood on the toilet paper or in the toilet after you poop.  Pain when passing poop.  Itching or irritation around the opening of the butt. How is this diagnosed? This condition may be diagnosed based on a physical exam. Your doctor may:  Check your butt. A tear can often be seen by checking the area with care.  Check your butt using a short tube (anoscope). The light in the tube will show any problems in your butt. How is this treated? Treatment for this condition may include:  Treating problems that make it hard for you to pass poop. You may be told to: ? Eat more fiber. ? Drink more fluid. ? Take fiber supplements. ? Take medicines that make poop soft.  Taking sitz baths. This may help to heal the tear.  Using creams and ointments. If your condition gets worse, other treatments may be needed such as:  A shot near the tear or crack (botulinum injection).  Surgery  to repair the tear or crack. Follow these instructions at home: Eating and drinking  Avoid bananas and dairy products. These foods can make it hard to poop.  Drink enough fluid to keep your pee (urine) pale yellow.  Eat foods that have a lot of fiber in them, such as: ? Beans. ? Whole grains. ? Fresh fruits. ? Fresh vegetables.   General instructions  Take over-the-counter and prescription medicines only as told by your doctor.  Use creams or ointments only as told by your doctor.  Keep the butt area as clean and dry as you can.  Take a warm water bath (sitz bath) as told by your doctor. Do not use soap.  Keep all follow-up visits as told by your doctor. This is important.   Contact a doctor if:  You have more bleeding.  You have a fever.  You have watery poop that is mixed with blood.  You have pain.  Your problem gets worse, not better. Summary  An anal fissure is a small tear or crack in the skin around the opening of the butt (anus).  This condition is usually caused by passing a large or hard poop (stool).  Treatment includes treating the problems that make it hard for you to pass poop.  Follow your doctor's instructions about caring for your condition at home.  Keep all follow-up visits as told by your doctor. This is important. This information is  not intended to replace advice given to you by your health care provider. Make sure you discuss any questions you have with your health care provider. Document Revised: 11/01/2017 Document Reviewed: 11/01/2017 Elsevier Patient Education  2021 [employer].

## 2020-11-02 NOTE — Progress Notes (Signed)
Patient ID: Bradley Douglas, male   DOB: 03/13/1991, 30 y.o.   MRN: [id number]  Chief Complaint  Patient presents with  . New Patient (Initial Visit)    Anal fissure    HPI Bradley Douglas is a 30 y.o. male.  Bradley Douglas has been referred by Dr. Servando Snare for further evaluation of an anal fissure.  Bradley Douglas states that roughly a year ago, Bradley Douglas had pain with bowel movements and some pink to bloody discharge on his toilet tissue.  Bradley Douglas had been sent to gastroenterology due to hematemesis versus hemoptysis.  During that visit, Bradley Douglas also mentioned the perianal concerns.  Bradley Douglas has been on a proton pump inhibitor and this has resolved the upper GI symptoms.  Bradley Douglas has continued to have issues with anorectal pain.  Bradley Douglas was prescribed some suppositories and thinks that they helped to some extent, but Bradley Douglas has had more issues and more pain and when Bradley Douglas recently saw Dr. Servando Snare, it was felt that Bradley Douglas had exhausted the options available to him there and referral to general surgery was placed.  Bradley Douglas states that Bradley Douglas has been on antibiotics for a dental abscess, which has resulted in a lot of diarrhea, exacerbating his anal irritation.  Bradley Douglas says that in the past, Bradley Douglas has had constipation and thinks that Bradley Douglas may have previously had a fissure that resolved with resolution of constipation.  There is no family history or personal history of Crohn's or ulcerative colitis.  Bradley Douglas says that Bradley Douglas also has a candidal infection of his perineal skin at the moment, but that it seems to be responding fairly well to topical clotrimazole.   Past Medical History:  Diagnosis Date  . ADHD 2015  . Anxiety   . Chronic otitis media of both ears    childhood  . Depression    on treatment during '02-2012  . GERD (gastroesophageal reflux disease)   . Nephrolithiasis   . Seasonal allergic rhinitis     Past Surgical History:  Procedure Laterality Date  . WISDOM TOOTH EXTRACTION      Family History  Problem Relation Age of Onset  . ADD / ADHD Mother    . Mitral valve prolapse Mother   . Cancer Maternal Grandfather        leukemia  . Hypertension Other        paternal side  . Cancer Maternal Grandmother   . Heart disease Neg Hx   . Diabetes Neg Hx   . Stroke Neg Hx     Social History Social History   Tobacco Use  . Smoking status: Never Smoker  . Smokeless tobacco: Never Used  Vaping Use  . Vaping Use: Never used  Substance Use Topics  . Alcohol use: Yes    Alcohol/week: 9.0 standard drinks    Types: 3 Glasses of wine, 3 Cans of beer, 3 Shots of liquor per week    Comment: 3x week  . Drug use: No    Allergies  Allergen Reactions  . Amoxicillin Swelling    Current Outpatient Medications  Medication Sig Dispense Refill  . buPROPion (WELLBUTRIN XL) 300 MG 24 hr tablet Take 300 mg by mouth daily.    . clotrimazole (CLOTRIMAZOLE ANTI-FUNGAL) 1 % cream Apply 1 application topically 2 (two) times daily. 40 g 0  . CONCERTA 36 MG CR tablet Take 36 mg by mouth daily.    Marland Kitchen esomeprazole (NEXIUM) 40 MG capsule TAKE 1 CAPSULE BY MOUTH EVERY DAY (Patient taking differently: 20 mg.)  90 capsule 3  . fluconazole (DIFLUCAN) 100 MG tablet Take 1 tablet (100 mg total) by mouth daily for 10 days. 30 tablet 0  . fluconazole (DIFLUCAN) 100 MG tablet Take 1 tablet (100 mg total) by mouth daily for 7 days. 7 tablet 0   No current facility-administered medications for this visit.    Review of Systems Review of Systems  Constitutional: Negative for chills and fever.  Respiratory: Negative for shortness of breath.   Cardiovascular: Negative for chest pain.  Gastrointestinal: Positive for anal bleeding, blood in stool, diarrhea and rectal pain. Negative for nausea and vomiting.  All other systems reviewed and are negative.   Blood pressure 124/83, pulse 99, temperature 99 F (37.2 C), temperature source Oral, height 5\' 10"  (1.778 m), weight 217 lb 12.8 oz (98.8 kg), SpO2 96 %.  Physical Exam Physical Exam Vitals reviewed. Exam  conducted with a chaperone present.  Constitutional:      General: Bradley Douglas is not in acute distress.    Appearance: Normal appearance. Bradley Douglas is obese.  HENT:     Head: Normocephalic and atraumatic.     Nose:     Comments: Covered with a mask    Mouth/Throat:     Comments: Covered with a mask Eyes:     General: No scleral icterus.       Right eye: No discharge.        Left eye: No discharge.  Neck:     Comments: The trachea is midline.  There is no palpable cervical or supraclavicular lymphadenopathy.  No thyromegaly or dominant thyroid masses appreciated.  The gland moves freely with deglutition. Cardiovascular:     Rate and Rhythm: Normal rate and regular rhythm.     Pulses: Normal pulses.  Pulmonary:     Effort: Pulmonary effort is normal. No respiratory distress.     Breath sounds: Normal breath sounds.  Abdominal:     General: Bowel sounds are normal.     Palpations: Abdomen is soft.  Genitourinary:    Rectum: Anal fissure present.       Comments: Anterior and posterior anal fissures present.  Surrounding skin is erythematous and beefy red, consistent with candidiasis. Musculoskeletal:        General: No swelling or tenderness.  Skin:    General: Skin is warm and dry.  Neurological:     General: No focal deficit present.     Mental Status: Bradley Douglas is alert and oriented to person, place, and time.  Psychiatric:        Mood and Affect: Mood normal.        Behavior: Behavior normal.     Data Reviewed I reviewed Dr. clinic note from September 2021, as well as his more recent note of Oct 21, 2020.  At the first visit, the anal fissure was diagnosed.  Anusol suppositories were prescribed and the patient was encouraged to use MiraLAX to ensure soft stools.  The more recent visit discussed significant pruritus ani and that the patient has tried a bunch of different over-the-counter topical agents for insatiable rectal itching.  Bradley Douglas continued to report of pain with bowel movements,  consistent with the known anal fissure.  At this visit, antifungal cream was prescribed and the patient was referred to surgery for further evaluation and management of the fissure.  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  Bradley Douglas has both an anterior and posterior fissure on exam today.  Bradley Douglas has had minimal relief with Anusol suppositories and currently has a candidal infection of his perianal skin.  Plan We will start by trying a 6-week course of topical lidocaine compounded with nifedipine.  This prescription was sent to our local compounding pharmacy.  I also prescribed Diflucan for systemic approach to the Candida and Bradley Douglas should continue to use the topical clotrimazole.  We also discussed possible future intervention, if the topical agent is ineffective.  I would start with Botox injection to the internal anal sphincter, which has a very high success rate.  I am willing to attempt this twice, but if Bradley Douglas has persistent or recurrent fissuring, then I would recommend a lateral internal sphincterotomy.  The potential risks of these procedures were discussed with the patient, including temporary or permanent incontinence of stool and/or gas.  I am hopeful that Bradley Douglas will not require any invasive intervention and will respond well to the topical agent.  I have scheduled him for a follow-up visit in 6 weeks' time for reevaluation, but if Bradley Douglas has healed at that point, Bradley Douglas may cancel the visit.    [name] 11/02/2020, 10:11 AM

## 2020-12-03 ENCOUNTER — Telehealth: Payer: Self-pay | Admitting: *Deleted

## 2020-12-03 ENCOUNTER — Other Ambulatory Visit: Payer: Self-pay

## 2020-12-03 NOTE — Telephone Encounter (Signed)
Per Dr Lady Gary the patient may try Miconazole Nitrate cream. This is over the counter. He will apply to the area twice a day for at least a week. He may call back if no improvement.

## 2020-12-03 NOTE — Telephone Encounter (Signed)
Patient called and was seen by Dr Lady Gary on 11/02/20 he has been prescribed Diflucan to help with the yeast infection but he feels that its not clearing up. He wants to know what the next steps are. Please call and advise

## 2020-12-16 ENCOUNTER — Encounter: Payer: Self-pay | Admitting: General Surgery

## 2020-12-16 ENCOUNTER — Other Ambulatory Visit: Payer: Self-pay

## 2020-12-16 ENCOUNTER — Ambulatory Visit (INDEPENDENT_AMBULATORY_CARE_PROVIDER_SITE_OTHER): Payer: 59 | Admitting: General Surgery

## 2020-12-16 VITALS — BP 126/77 | HR 91 | Temp 98.3°F | Ht 70.0 in | Wt 216.6 lb

## 2020-12-16 DIAGNOSIS — K602 Anal fissure, unspecified: Secondary | ICD-10-CM

## 2020-12-16 NOTE — Progress Notes (Signed)
Patient ID: Bradley Douglas, male   DOB: 05/21/1991, 30 y.o.   MRN: [id number]  Chief Complaint  Patient presents with   Follow-up    Anal fissure    HPI Bradley Douglas is a 30 y.o. male.   I initially saw him at the end of May 2022.  I have copied my original HPI here:  "He has been referred by Dr. Allen Norris for further evaluation of an anal fissure.  [name] states that roughly a year ago, he had pain with bowel movements and some pink to bloody discharge on his toilet tissue.  He had been sent to gastroenterology due to hematemesis versus hemoptysis.  During that visit, he also mentioned the perianal concerns.  He has been on a proton pump inhibitor and this has resolved the upper GI symptoms.  He has continued to have issues with anorectal pain.  He was prescribed some suppositories and thinks that they helped to some extent, but he has had more issues and more pain and when he recently saw Dr. Allen Norris, it was felt that he had exhausted the options available to him there and referral to general surgery was placed.  [name] states that he has been on antibiotics for a dental abscess, which has resulted in a lot of diarrhea, exacerbating his anal irritation.  He says that in the past, he has had constipation and thinks that he may have previously had a fissure that resolved with resolution of constipation.  There is no family history or personal history of Crohn's or ulcerative colitis.  He says that he also has a candidal infection of his perineal skin at the moment, but that it seems to be responding fairly well to topical clotrimazole."  At that visit, we elected to pursue conservative management and he was given a prescription for compounded nifedipine and lidocaine ointment.  He was also given a prescription for fluconazole to try and address his cutaneous candidiasis.  Today he returns for follow-up.  He states that he experienced minimal relief from the systemic Diflucan.  He continues to  use topical miconazole.  He feels like the skin is breaking down further and does not feel like he has had any relief from his anal fissure.  He is interested in pursuing the next step in treatment.     Past Medical History:  Diagnosis Date   ADHD 2015   Anxiety    Chronic otitis media of both ears    childhood   Depression    on treatment during '02-2012   GERD (gastroesophageal reflux disease)    Nephrolithiasis    Seasonal allergic rhinitis     Past Surgical History:  Procedure Laterality Date   WISDOM TOOTH EXTRACTION      Family History  Problem Relation Age of Onset   ADD / ADHD Mother    Mitral valve prolapse Mother    Cancer Maternal Grandfather        leukemia   Hypertension Other        paternal side   Cancer Maternal Grandmother    Heart disease Neg Hx    Diabetes Neg Hx    Stroke Neg Hx     Social History Social History   Tobacco Use   Smoking status: Never   Smokeless tobacco: Never  Vaping Use   Vaping Use: Never used  Substance Use Topics   Alcohol use: Yes    Alcohol/week: 9.0 standard drinks    Types: 3 Glasses of  wine, 3 Cans of beer, 3 Shots of liquor per week    Comment: 3x week   Drug use: No    Allergies  Allergen Reactions   Amoxicillin Swelling    Current Outpatient Medications  Medication Sig Dispense Refill   buPROPion (WELLBUTRIN XL) 300 MG 24 hr tablet Take 300 mg by mouth daily.     CONCERTA 36 MG CR tablet Take 36 mg by mouth daily.     esomeprazole (NEXIUM) 40 MG capsule TAKE 1 CAPSULE BY MOUTH EVERY DAY (Patient taking differently: 20 mg.) 90 capsule 3   Miconazole Nitrate-Wipes (MONISTAT 7 COMPLETE THERAPY) 100-2 MG-% KIT      clotrimazole (CLOTRIMAZOLE ANTI-FUNGAL) 1 % cream Apply 1 application topically 2 (two) times daily. 40 g 0   No current facility-administered medications for this visit.    Review of Systems Review of Systems  Skin:  Positive for rash.       Pruritus  All other systems reviewed and are  negative. Or as discussed in the history of present illness.  Blood pressure 126/77, pulse 91, temperature 98.3 F (36.8 C), temperature source Oral, height _0  (1.778 m), weight 216 lb 9.6 oz (98.2 kg), SpO2 97 %. Body mass index is 31.08 kg/m.  Physical Exam Physical Exam Vitals reviewed. Exam conducted with a chaperone present.  Constitutional:      General: He is not in acute distress.    Appearance: He is obese.  HENT:     Head: Normocephalic and atraumatic.     Nose:     Comments: Covered with a mask    Mouth/Throat:     Comments: Covered with a mask Eyes:     General: No scleral icterus.       Right eye: No discharge.        Left eye: No discharge.  Neck:     Comments: No palpable cervical or supraclavicular lymphadenopathy.  The trachea is midline.  No palpable thyromegaly or dominant thyroid masses appreciated.  The gland moves freely with deglutition. Cardiovascular:     Rate and Rhythm: Normal rate and regular rhythm.     Pulses: Normal pulses.  Pulmonary:     Effort: Pulmonary effort is normal.     Breath sounds: Normal breath sounds.  Abdominal:     General: Bowel sounds are normal.     Palpations: Abdomen is soft.  Genitourinary:    Rectum: Anal fissure present.       Comments: Small anterior anal fissure with sentinel pile.  Extensive perianal candidiasis with skin cracking. Musculoskeletal:        General: No swelling or tenderness.  Skin:    General: Skin is warm and dry.  Neurological:     General: No focal deficit present.     Mental Status: He is alert and oriented to person, place, and time.  Psychiatric:        Mood and Affect: Mood normal.        Behavior: Behavior normal.    Data Reviewed There are no new data to review.  Assessment This is a 30 year old man with an anal fissure that has not responded to conservative management.  He also has perineal candidiasis that is also been fairly resistant to both topical and systemic agents.   He seems to have very sensitive skin, and that red marks were left on his neck after my examination of his thyroid and lymph nodes.  He indicated that he does have a dermatology appointment  scheduled, which may be the best next option for addressing his candidiasis.  Plan In regards to the anal fissure.  I have recommended that he undergo Botox injection of the internal sphincter muscle.  I discussed the risks of the procedure with him, which include, but are not limited to, bleeding, infection, persistent fissure, pain, temporary loss of continence to stool or gas.  He also understands that if this treatment fails, he will likely require surgical intervention and I will need to refer him to another surgeon as I do not perform lateral internal sphincterotomy.  He would like to proceed.  We will work on getting him scheduled.    Fredirick Maudlin 12/16/2020, 12:08 PM

## 2020-12-16 NOTE — H&P (View-Only) (Signed)
Patient ID: Bradley Douglas, male   DOB: 05/21/1991, 30 y.o.   MRN: [id number]  Chief Complaint  Patient presents with   Follow-up    Anal fissure    HPI Bradley Douglas is a 30 y.o. male.   I initially saw him at the end of May 2022.  I have copied my original HPI here:  "He has been referred by Dr. Allen Norris for further evaluation of an anal fissure.  [name] states that roughly a year ago, he had pain with bowel movements and some pink to bloody discharge on his toilet tissue.  He had been sent to gastroenterology due to hematemesis versus hemoptysis.  During that visit, he also mentioned the perianal concerns.  He has been on a proton pump inhibitor and this has resolved the upper GI symptoms.  He has continued to have issues with anorectal pain.  He was prescribed some suppositories and thinks that they helped to some extent, but he has had more issues and more pain and when he recently saw Dr. Allen Norris, it was felt that he had exhausted the options available to him there and referral to general surgery was placed.  [name] states that he has been on antibiotics for a dental abscess, which has resulted in a lot of diarrhea, exacerbating his anal irritation.  He says that in the past, he has had constipation and thinks that he may have previously had a fissure that resolved with resolution of constipation.  There is no family history or personal history of Crohn's or ulcerative colitis.  He says that he also has a candidal infection of his perineal skin at the moment, but that it seems to be responding fairly well to topical clotrimazole."  At that visit, we elected to pursue conservative management and he was given a prescription for compounded nifedipine and lidocaine ointment.  He was also given a prescription for fluconazole to try and address his cutaneous candidiasis.  Today he returns for follow-up.  He states that he experienced minimal relief from the systemic Diflucan.  He continues to  use topical miconazole.  He feels like the skin is breaking down further and does not feel like he has had any relief from his anal fissure.  He is interested in pursuing the next step in treatment.     Past Medical History:  Diagnosis Date   ADHD 2015   Anxiety    Chronic otitis media of both ears    childhood   Depression    on treatment during '02-2012   GERD (gastroesophageal reflux disease)    Nephrolithiasis    Seasonal allergic rhinitis     Past Surgical History:  Procedure Laterality Date   WISDOM TOOTH EXTRACTION      Family History  Problem Relation Age of Onset   ADD / ADHD Mother    Mitral valve prolapse Mother    Cancer Maternal Grandfather        leukemia   Hypertension Other        paternal side   Cancer Maternal Grandmother    Heart disease Neg Hx    Diabetes Neg Hx    Stroke Neg Hx     Social History Social History   Tobacco Use   Smoking status: Never   Smokeless tobacco: Never  Vaping Use   Vaping Use: Never used  Substance Use Topics   Alcohol use: Yes    Alcohol/week: 9.0 standard drinks    Types: 3 Glasses of  wine, 3 Cans of beer, 3 Shots of liquor per week    Comment: 3x week   Drug use: No    Allergies  Allergen Reactions   Amoxicillin Swelling    Current Outpatient Medications  Medication Sig Dispense Refill   buPROPion (WELLBUTRIN XL) 300 MG 24 hr tablet Take 300 mg by mouth daily.     CONCERTA 36 MG CR tablet Take 36 mg by mouth daily.     esomeprazole (NEXIUM) 40 MG capsule TAKE 1 CAPSULE BY MOUTH EVERY DAY (Patient taking differently: 20 mg.) 90 capsule 3   Miconazole Nitrate-Wipes (MONISTAT 7 COMPLETE THERAPY) 100-2 MG-% KIT      clotrimazole (CLOTRIMAZOLE ANTI-FUNGAL) 1 % cream Apply 1 application topically 2 (two) times daily. 40 g 0   No current facility-administered medications for this visit.    Review of Systems Review of Systems  Skin:  Positive for rash.       Pruritus  All other systems reviewed and are  negative. Or as discussed in the history of present illness.  Blood pressure 126/77, pulse 91, temperature 98.3 F (36.8 C), temperature source Oral, height _0  (1.778 m), weight 216 lb 9.6 oz (98.2 kg), SpO2 97 %. Body mass index is 31.08 kg/m.  Physical Exam Physical Exam Vitals reviewed. Exam conducted with a chaperone present.  Constitutional:      General: He is not in acute distress.    Appearance: He is obese.  HENT:     Head: Normocephalic and atraumatic.     Nose:     Comments: Covered with a mask    Mouth/Throat:     Comments: Covered with a mask Eyes:     General: No scleral icterus.       Right eye: No discharge.        Left eye: No discharge.  Neck:     Comments: No palpable cervical or supraclavicular lymphadenopathy.  The trachea is midline.  No palpable thyromegaly or dominant thyroid masses appreciated.  The gland moves freely with deglutition. Cardiovascular:     Rate and Rhythm: Normal rate and regular rhythm.     Pulses: Normal pulses.  Pulmonary:     Effort: Pulmonary effort is normal.     Breath sounds: Normal breath sounds.  Abdominal:     General: Bowel sounds are normal.     Palpations: Abdomen is soft.  Genitourinary:    Rectum: Anal fissure present.       Comments: Small anterior anal fissure with sentinel pile.  Extensive perianal candidiasis with skin cracking. Musculoskeletal:        General: No swelling or tenderness.  Skin:    General: Skin is warm and dry.  Neurological:     General: No focal deficit present.     Mental Status: He is alert and oriented to person, place, and time.  Psychiatric:        Mood and Affect: Mood normal.        Behavior: Behavior normal.    Data Reviewed There are no new data to review.  Assessment This is a 30 year old man with an anal fissure that has not responded to conservative management.  He also has perineal candidiasis that is also been fairly resistant to both topical and systemic agents.   He seems to have very sensitive skin, and that red marks were left on his neck after my examination of his thyroid and lymph nodes.  He indicated that he does have a dermatology appointment  scheduled, which may be the best next option for addressing his candidiasis.  Plan In regards to the anal fissure.  I have recommended that he undergo Botox injection of the internal sphincter muscle.  I discussed the risks of the procedure with him, which include, but are not limited to, bleeding, infection, persistent fissure, pain, temporary loss of continence to stool or gas.  He also understands that if this treatment fails, he will likely require surgical intervention and I will need to refer him to another surgeon as I do not perform lateral internal sphincterotomy.  He would like to proceed.  We will work on getting him scheduled.    Fredirick Maudlin 12/16/2020, 12:08 PM

## 2020-12-16 NOTE — Patient Instructions (Signed)
Try using Miconazole powder.    Our surgery scheduler will call you within 24-48 hours to schedule your surgery. Please have the Blue surgery sheet available when speaking with her.   Anal Fissure, Adult  An anal fissure is a small tear or crack in the tissue around the opening of the butt (anus). Bleeding from the tear or crack usually stops on its own within a few minutes. The bleeding may happen every time you poop (have a bowel movement) until the tear or crack heals. What are the causes? This condition is usually caused by passing a large or hard poop (stool). Other causes include: Trouble pooping (constipation). Passing watery poop (diarrhea). Inflammatory bowel disease (Crohn's disease or ulcerative colitis). Childbirth. Infections. Anal sex. What are the signs or symptoms? Symptoms of this condition include: Bleeding from the butt. Small amounts of blood on your poop. The blood coats the outside of the poop. It is not mixed with the poop. Small amounts of blood on the toilet paper or in the toilet after you poop. Pain when passing poop. Itching or irritation around the opening of the butt. How is this diagnosed? This condition may be diagnosed based on a physical exam. Your doctor may: Check your butt. A tear can often be seen by checking the area with care. Check your butt using a short tube (anoscope). The light in the tube will show any problems in your butt. How is this treated? Treatment for this condition may include: Treating problems that make it hard for you to pass poop. You may be told to: Eat more fiber. Drink more fluid. Take fiber supplements. Take medicines that make poop soft. Taking sitz baths. This may help to heal the tear. Using creams and ointments. If your condition gets worse, other treatments may be needed such as: A shot near the tear or crack (botulinum injection). Surgery to repair the tear or crack. Follow these instructions at home: Eating  and drinking  Avoid bananas and dairy products. These foods can make it hard to poop. Drink enough fluid to keep your pee (urine) pale yellow. Eat foods that have a lot of fiber in them, such as: Beans. Whole grains. Fresh fruits. Fresh vegetables.  General instructions  Take over-the-counter and prescription medicines only as told by your doctor. Use creams or ointments only as told by your doctor. Keep the butt area as clean and dry as you can. Take a warm water bath (sitz bath) as told by your doctor. Do not use soap. Keep all follow-up visits as told by your doctor. This is important.  Contact a doctor if: You have more bleeding. You have a fever. You have watery poop that is mixed with blood. You have pain. Your problem gets worse, not better. Summary An anal fissure is a small tear or crack in the skin around the opening of the butt (anus). This condition is usually caused by passing a large or hard poop (stool). Treatment includes treating the problems that make it hard for you to pass poop. Follow your doctor's instructions about caring for your condition at home. Keep all follow-up visits as told by your doctor. This is important. This information is not intended to replace advice given to you by your health care provider. Make sure you discuss any questions you have with your healthcare provider. Document Revised: 11/01/2017 Document Reviewed: 11/01/2017 Elsevier Patient Education  2022 [employer].

## 2020-12-17 ENCOUNTER — Telehealth: Payer: Self-pay | Admitting: General Surgery

## 2020-12-17 NOTE — Telephone Encounter (Signed)
Patient has been advised of Pre-Admission date/time, COVID Testing date and Surgery date.  Surgery Date: 12/31/20 Preadmission Testing Date: 12/28/2020 (phone 8a-1p) Covid Testing Date: Not needed.     Patient has been made aware to call [phone], between 1-3:00pm the day before surgery, to find out what time to arrive for surgery.

## 2020-12-28 ENCOUNTER — Other Ambulatory Visit: Payer: Self-pay

## 2020-12-28 ENCOUNTER — Encounter
Admission: RE | Admit: 2020-12-28 | Discharge: 2020-12-28 | Disposition: A | Discharge status: Home / Self Care | Payer: 59 | Source: Ambulatory Visit | Attending: General Surgery | Admitting: General Surgery

## 2020-12-28 HISTORY — DX: Personal history of urinary calculi: Z87.442

## 2020-12-28 NOTE — Patient Instructions (Addendum)
Your procedure is scheduled on: Friday 7/29 Report to Day Surgery.  Stop by the registration first To find out your arrival time please call [phone] between 1PM - 3PM on Thurs. 7/28  Remember: Instructions that are not followed completely may result in serious medical risk,  up to and including death, or upon the discretion of your surgeon and anesthesiologist your  surgery may need to be rescheduled.     _X__ 1. Do not eat food after midnight the night before your procedure.                 No chewing gum or hard candies. You may drink clear liquids up to 2 hours                 before you are scheduled to arrive for your surgery- DO not drink clear                 liquids within 2 hours of the start of your surgery.                 Clear Liquids include:  water, apple juice without pulp, clear Gatorade, G2 or                  Gatorade Zero (avoid Red/Purple/Blue), Black Coffee or Tea (Do not add                 anything to coffee or tea). _____2.   Complete the "Ensure Clear Pre-surgery Clear Carbohydrate Drink"       provided to you, 2 hours before arrival. **If you are diabetic you will be       provided with an alternative drink, Gatorade Zero or G2.  __X__2.  On the morning of surgery brush your teeth with toothpaste and water, you                may rinse your mouth with mouthwash if you wish.  Do not swallow any         toothpaste of mouthwash.     _X__ 3.  No Alcohol for 24 hours before or after surgery.   ___ 4.  Do Not Smoke or use e-cigarettes For 24 Hours Prior to Your Surgery.                 Do not use any chewable tobacco products for at least 6 hours prior to                 Surgery.  __  5.  Do not use any recreational drugs (marijuana, cocaine, heroin, ecstasy,       MDMA or other) For at least one week prior to your surgery.            Combination of these drugs with anesthesia may have life threatening       results.  ____  6.  Bring all  medications with you on the day of surgery if instructed.   __x__  7.  Notify your doctor if there is any change in your medical condition      (cold, fever, infections).     Do not wear jewelry, Do not wear lotions, You may wear deodorant. Do not shave 48 hours prior to surgery. Men may shave face and neck. Do not bring valuables to the hospital.    [facility] is not responsible for any belongings or valuables.  Contacts, dentures or bridgework may not be worn into  surgery. Leave your suitcase in the car. After surgery it may be brought to your room. For patients admitted to the hospital, discharge time is determined by your treatment team.   Patients discharged the day of surgery will not be allowed to drive home.   Make arrangements for someone to be with you for the first 24 hours of your Same Day Discharge.    Please read over the following fact sheets that you were given:       _x___ Take these medicines the morning of surgery with A SIP OF WATER:    1. buPROPion (WELLBUTRIN XL) 150 MG 24 hr tablet  2. esomeprazole (NEXIUM) 20 MG capsule   Take dose the night before and the morning of surgery  3.  May take eye drops and nasacort if needed  4.  5.  6.  __x__ Fleet Enema (as directed) The night before and the morning of surgery  __x__ Shower the night before and morning of surgery  ____ Use Benzoyl Peroxide Gel as instructed  ____ Use inhalers on the day of surgery  ____ Stop metformin 2 days prior to surgery    ____ Take 1/2 of usual insulin dose the night before surgery. No insulin the morning          of surgery.   ____ Stop Coumadin/Plavix/aspirin on   ___x_ Stop Anti-inflammatories no ibuprofen aleve or aspirin until after surgery   ___x_ Stop supplements until after surgery.  Gummies until after surgery  ____ Bring C-Pap to the hospital.    If you have any questions regarding your pre-procedure instructions,  Please call Pre-admit Testing at  [phone]

## 2020-12-31 ENCOUNTER — Ambulatory Visit
Admission: RE | Admit: 2020-12-31 | Discharge: 2020-12-31 | Disposition: A | Discharge status: Home / Self Care | Payer: 59 | Attending: General Surgery | Admitting: General Surgery

## 2020-12-31 ENCOUNTER — Ambulatory Visit: Payer: 59 | Admitting: Anesthesiology

## 2020-12-31 ENCOUNTER — Encounter: Payer: Self-pay | Admitting: General Surgery

## 2020-12-31 ENCOUNTER — Encounter
Admission: RE | Disposition: A | Discharge status: Home / Self Care | Payer: Self-pay | Source: Home / Self Care | Attending: General Surgery

## 2020-12-31 DIAGNOSIS — B372 Candidiasis of skin and nail: Secondary | ICD-10-CM | POA: Diagnosis not present

## 2020-12-31 DIAGNOSIS — Z880 Allergy status to penicillin: Secondary | ICD-10-CM | POA: Insufficient documentation

## 2020-12-31 DIAGNOSIS — K602 Anal fissure, unspecified: Secondary | ICD-10-CM | POA: Diagnosis present

## 2020-12-31 DIAGNOSIS — K6289 Other specified diseases of anus and rectum: Secondary | ICD-10-CM | POA: Diagnosis not present

## 2020-12-31 DIAGNOSIS — K601 Chronic anal fissure: Secondary | ICD-10-CM

## 2020-12-31 DIAGNOSIS — Z79899 Other long term (current) drug therapy: Secondary | ICD-10-CM | POA: Diagnosis not present

## 2020-12-31 DIAGNOSIS — B3749 Other urogenital candidiasis: Secondary | ICD-10-CM | POA: Diagnosis not present

## 2020-12-31 HISTORY — PX: BOTOX INJECTION: SHX5754

## 2020-12-31 SURGERY — EXAM UNDER ANESTHESIA
Anesthesia: General

## 2020-12-31 MED ORDER — IBUPROFEN 800 MG PO TABS: 800.0000 mg | ORAL_TABLET | Freq: Three times a day (TID) | ORAL | 0 refills | Status: DC | PRN

## 2020-12-31 MED ORDER — ROCURONIUM BROMIDE 100 MG/10ML IV SOLN
INTRAVENOUS | Status: DC | PRN
  Administered 2020-12-31: 30 mg via INTRAVENOUS
  Administered 2020-12-31: 10 mg via INTRAVENOUS

## 2020-12-31 MED ORDER — SUGAMMADEX SODIUM 500 MG/5ML IV SOLN
INTRAVENOUS | Status: DC | PRN
  Administered 2020-12-31: 400 mg via INTRAVENOUS

## 2020-12-31 MED ORDER — ONDANSETRON HCL 4 MG/2ML IJ SOLN: 4.0000 mg | Freq: Once | INTRAMUSCULAR | Status: DC | PRN

## 2020-12-31 MED ORDER — CHLORHEXIDINE GLUCONATE CLOTH 2 % EX PADS
6.0000 | MEDICATED_PAD | Freq: Once | CUTANEOUS | Status: DC
Start: 2020-12-31 — End: 2020-12-31

## 2020-12-31 MED ORDER — ACETAMINOPHEN 500 MG PO TABS: 1000.0000 mg | ORAL_TABLET | Freq: Four times a day (QID) | ORAL | 0 refills | Status: AC | PRN

## 2020-12-31 MED ORDER — LACTATED RINGERS IV SOLN: INTRAVENOUS | Status: DC

## 2020-12-31 MED ORDER — MIDAZOLAM HCL 2 MG/2ML IJ SOLN
INTRAMUSCULAR | Status: AC
  Filled 2020-12-31: qty 2

## 2020-12-31 MED ORDER — FENTANYL CITRATE (PF) 100 MCG/2ML IJ SOLN
INTRAMUSCULAR | Status: AC
  Filled 2020-12-31: qty 2

## 2020-12-31 MED ORDER — LIDOCAINE HCL (CARDIAC) PF 100 MG/5ML IV SOSY
PREFILLED_SYRINGE | INTRAVENOUS | Status: DC | PRN
  Administered 2020-12-31: 100 mg via INTRAVENOUS

## 2020-12-31 MED ORDER — CHLORHEXIDINE GLUCONATE 0.12 % MT SOLN: 15.0000 mL | Freq: Once | OROMUCOSAL | Status: AC

## 2020-12-31 MED ORDER — DEXAMETHASONE SODIUM PHOSPHATE 10 MG/ML IJ SOLN
INTRAMUSCULAR | Status: DC | PRN
  Administered 2020-12-31: 10 mg via INTRAVENOUS

## 2020-12-31 MED ORDER — BUPIVACAINE LIPOSOME 1.3 % IJ SUSP
INTRAMUSCULAR | Status: AC
  Filled 2020-12-31: qty 20

## 2020-12-31 MED ORDER — DEXMEDETOMIDINE (PRECEDEX) IN NS 20 MCG/5ML (4 MCG/ML) IV SYRINGE
PREFILLED_SYRINGE | INTRAVENOUS | Status: DC | PRN
  Administered 2020-12-31 (×2): 12 ug via INTRAVENOUS
  Administered 2020-12-31: 8 ug via INTRAVENOUS

## 2020-12-31 MED ORDER — ONABOTULINUMTOXINA 100 UNITS IJ SOLR
50.0000 [IU] | Freq: Once | INTRAMUSCULAR | Status: DC
  Filled 2020-12-31: qty 100

## 2020-12-31 MED ORDER — MIDAZOLAM HCL 2 MG/2ML IJ SOLN
INTRAMUSCULAR | Status: DC | PRN
  Administered 2020-12-31: 2 mg via INTRAVENOUS

## 2020-12-31 MED ORDER — CHLORHEXIDINE GLUCONATE CLOTH 2 % EX PADS: 6.0000 | MEDICATED_PAD | Freq: Once | CUTANEOUS | Status: DC

## 2020-12-31 MED ORDER — 0.9 % SODIUM CHLORIDE (POUR BTL) OPTIME
TOPICAL | Status: DC | PRN
  Administered 2020-12-31: 50 mL

## 2020-12-31 MED ORDER — SODIUM CHLORIDE (PF) 0.9 % IJ SOLN
INTRAMUSCULAR | Status: AC
  Filled 2020-12-31: qty 10

## 2020-12-31 MED ORDER — GABAPENTIN 300 MG PO CAPS
ORAL_CAPSULE | ORAL | Status: AC
  Filled 2020-12-31: qty 1

## 2020-12-31 MED ORDER — BUPIVACAINE LIPOSOME 1.3 % IJ SUSP: 20.0000 mL | Freq: Once | INTRAMUSCULAR | Status: DC

## 2020-12-31 MED ORDER — FENTANYL CITRATE (PF) 100 MCG/2ML IJ SOLN: 25.0000 ug | INTRAMUSCULAR | Status: DC | PRN

## 2020-12-31 MED ORDER — CELECOXIB 200 MG PO CAPS
200.0000 mg | ORAL_CAPSULE | ORAL | Status: AC
Start: 2020-12-31 — End: 2020-12-31
  Administered 2020-12-31: 200 mg via ORAL

## 2020-12-31 MED ORDER — ACETAMINOPHEN 500 MG PO TABS
ORAL_TABLET | ORAL | Status: AC
  Filled 2020-12-31: qty 2

## 2020-12-31 MED ORDER — ACETAMINOPHEN 500 MG PO TABS
1000.0000 mg | ORAL_TABLET | ORAL | Status: AC
  Administered 2020-12-31: 1000 mg via ORAL

## 2020-12-31 MED ORDER — FLEET ENEMA 7-19 GM/118ML RE ENEM: 1.0000 | ENEMA | Freq: Once | RECTAL | Status: DC

## 2020-12-31 MED ORDER — CHLORHEXIDINE GLUCONATE 0.12 % MT SOLN
OROMUCOSAL | Status: AC
  Administered 2020-12-31: 15 mL via OROMUCOSAL
  Filled 2020-12-31: qty 15

## 2020-12-31 MED ORDER — CELECOXIB 200 MG PO CAPS
ORAL_CAPSULE | ORAL | Status: AC
  Filled 2020-12-31: qty 1

## 2020-12-31 MED ORDER — GABAPENTIN 300 MG PO CAPS
300.0000 mg | ORAL_CAPSULE | ORAL | Status: AC
  Administered 2020-12-31: 300 mg via ORAL

## 2020-12-31 MED ORDER — GLYCOPYRROLATE 0.2 MG/ML IJ SOLN
INTRAMUSCULAR | Status: DC | PRN
  Administered 2020-12-31: .2 mg via INTRAVENOUS

## 2020-12-31 MED ORDER — THROMBIN 5000 UNITS EX SOLR
CUTANEOUS | Status: AC
  Filled 2020-12-31: qty 5000

## 2020-12-31 MED ORDER — ONDANSETRON HCL 4 MG/2ML IJ SOLN
INTRAMUSCULAR | Status: DC | PRN
  Administered 2020-12-31 (×2): 4 mg via INTRAVENOUS

## 2020-12-31 MED ORDER — SUCCINYLCHOLINE CHLORIDE 200 MG/10ML IV SOSY
PREFILLED_SYRINGE | INTRAVENOUS | Status: DC | PRN
  Administered 2020-12-31: 100 mg via INTRAVENOUS

## 2020-12-31 MED ORDER — ONABOTULINUMTOXINA 100 UNITS IJ SOLR
INTRAMUSCULAR | Status: DC | PRN
  Administered 2020-12-31: 100 [IU] via INTRAMUSCULAR

## 2020-12-31 MED ORDER — BUPIVACAINE-EPINEPHRINE (PF) 0.25% -1:200000 IJ SOLN
INTRAMUSCULAR | Status: AC
  Filled 2020-12-31: qty 30

## 2020-12-31 MED ORDER — PROPOFOL 10 MG/ML IV BOLUS
INTRAVENOUS | Status: DC | PRN
  Administered 2020-12-31: 200 mg via INTRAVENOUS

## 2020-12-31 MED ORDER — ORAL CARE MOUTH RINSE: 15.0000 mL | Freq: Once | OROMUCOSAL | Status: AC

## 2020-12-31 MED ORDER — ESMOLOL HCL 100 MG/10ML IV SOLN
INTRAVENOUS | Status: DC | PRN
  Administered 2020-12-31: 15 mg via INTRAVENOUS

## 2020-12-31 MED ORDER — FENTANYL CITRATE (PF) 100 MCG/2ML IJ SOLN
INTRAMUSCULAR | Status: DC | PRN
  Administered 2020-12-31 (×2): 50 ug via INTRAVENOUS

## 2020-12-31 MED ORDER — GELATIN ABSORBABLE 100 CM EX MISC
CUTANEOUS | Status: AC
  Filled 2020-12-31: qty 1

## 2020-12-31 SURGICAL SUPPLY — 30 items
CANISTER SUCT 1200ML W/VALVE (MISCELLANEOUS) ×2 IMPLANT
DRAPE LAPAROTOMY 77X122 PED (DRAPES) ×2 IMPLANT
DRSG GAUZE FLUFF 36X18 (GAUZE/BANDAGES/DRESSINGS) ×1 IMPLANT
ELECT CAUTERY BLADE TIP 2.5 (TIP) ×2
ELECT REM PT RETURN 9FT ADLT (ELECTROSURGICAL) ×2
ELECTRODE CAUTERY BLDE TIP 2.5 (TIP) ×1 IMPLANT
ELECTRODE REM PT RTRN 9FT ADLT (ELECTROSURGICAL) ×1 IMPLANT
GAUZE 4X4 16PLY ~~LOC~~+RFID DBL (SPONGE) ×2 IMPLANT
GLOVE SURG SYN 6.5 ES PF (GLOVE) ×4 IMPLANT
GLOVE SURG SYN 6.5 PF PI (GLOVE) ×1 IMPLANT
GLOVE SURG UNDER LTX SZ7 (GLOVE) ×3 IMPLANT
GOWN STRL REUS W/ TWL LRG LVL3 (GOWN DISPOSABLE) ×2 IMPLANT
GOWN STRL REUS W/TWL LRG LVL3 (GOWN DISPOSABLE) ×4
KIT ANAL FISTULA (Mesh General) IMPLANT
KIT TURNOVER KIT A (KITS) ×2 IMPLANT
LABEL OR SOLS (LABEL) ×2 IMPLANT
MANIFOLD NEPTUNE II (INSTRUMENTS) ×2 IMPLANT
NEEDLE HYPO 22GX1.5 SAFETY (NEEDLE) ×2 IMPLANT
PACK BASIN MINOR ARMC (MISCELLANEOUS) ×2 IMPLANT
PAD PREP 24X41 OB/GYN DISP (PERSONAL CARE ITEMS) ×1 IMPLANT
SOL PREP PVP 2OZ (MISCELLANEOUS) ×2
SOLUTION PREP PVP 2OZ (MISCELLANEOUS) ×1 IMPLANT
SPONGE T-LAP 18X18 ~~LOC~~+RFID (SPONGE) ×2 IMPLANT
SURGILUBE 2OZ TUBE FLIPTOP (MISCELLANEOUS) ×2 IMPLANT
SUT SILK 0 CT 1 30 (SUTURE) ×2 IMPLANT
SUT VIC AB 3-0 SH 27 (SUTURE) ×2
SUT VIC AB 3-0 SH 27X BRD (SUTURE) IMPLANT
SYR 20ML LL LF (SYRINGE) ×4 IMPLANT
SYR BULB IRRIG 60ML STRL (SYRINGE) ×2 IMPLANT
TAPE CLOTH 3X10 WHT NS LF (GAUZE/BANDAGES/DRESSINGS) ×2 IMPLANT

## 2020-12-31 NOTE — Anesthesia Preprocedure Evaluation (Signed)
Anesthesia Evaluation  Patient identified by MRN, date of birth, ID band Patient awake    Reviewed: Allergy & Precautions, H&P , NPO status , Patient's Chart, lab work & pertinent test results, reviewed documented beta blocker date and time   Airway Mallampati: II   Neck ROM: full    Dental  (+) Teeth Intact   Pulmonary neg pulmonary ROS,    Pulmonary exam normal        Cardiovascular Exercise Tolerance: Good negative cardio ROS Normal cardiovascular exam Rhythm:regular Rate:Normal     Neuro/Psych PSYCHIATRIC DISORDERS Anxiety Depression negative neurological ROS     GI/Hepatic Neg liver ROS, GERD  Medicated,  Endo/Other  negative endocrine ROS  Renal/GU negative Renal ROS  negative genitourinary   Musculoskeletal   Abdominal   Peds  Hematology negative hematology ROS (+)   Anesthesia Other Findings Past Medical History: 2015: ADHD No date: Anxiety No date: Chronic otitis media of both ears     Comment:  childhood No date: Depression     Comment:  on treatment during '02-2012 No date: GERD (gastroesophageal reflux disease) No date: History of kidney stones No date: Seasonal allergic rhinitis Past Surgical History: No date: ESOPHAGOGASTRODUODENOSCOPY ENDOSCOPY No date: SKIN LESION EXCISION     Comment:  abd inner thigh back left thigh No date: WISDOM TOOTH EXTRACTION BMI    Body Mass Index: 30.85 kg/m     Reproductive/Obstetrics negative OB ROS                             Anesthesia Physical Anesthesia Plan  ASA: 2  Anesthesia Plan: General   Post-op Pain Management:    Induction:   PONV Risk Score and Plan: 3  Airway Management Planned:   Additional Equipment:   Intra-op Plan:   Post-operative Plan:   Informed Consent: I have reviewed the patients History and Physical, chart, labs and discussed the procedure including the risks, benefits and alternatives for  the proposed anesthesia with the patient or authorized representative who has indicated his/her understanding and acceptance.     Dental Advisory Given  Plan Discussed with: CRNA  Anesthesia Plan Comments:         Anesthesia Quick Evaluation

## 2020-12-31 NOTE — Interval H&P Note (Signed)
History and Physical Interval Note:  12/31/2020 7:18 AM  Bradley Douglas  has presented today for surgery, with the diagnosis of anal fissure.  The various methods of treatment have been discussed with the patient and family. After consideration of risks, benefits and other options for treatment, the patient has consented to  Procedure(s): EXAM UNDER ANESTHESIA (N/A) BOTOX INJECTION into internal sphincter (N/A) as a surgical intervention.  The patient's history has been reviewed, patient examined, no change in status, stable for surgery.  I have reviewed the patient's chart and labs.  Questions were answered to the patient's satisfaction.     [name]

## 2020-12-31 NOTE — Transfer of Care (Signed)
Immediate Anesthesia Transfer of Care Note  Patient: Bradley Douglas  Procedure(s) Performed: Francia Greaves UNDER ANESTHESIA BOTOX INJECTION into internal sphincter  Patient Location: PACU  Anesthesia Type:General  Level of Consciousness: drowsy and patient cooperative  Airway & Oxygen Therapy: Patient Spontanous Breathing and Patient connected to face mask oxygen  Post-op Assessment: Report given to RN and Post -op Vital signs reviewed and stable  Post vital signs: Reviewed and stable  Last Vitals:  Vitals Value Taken Time  BP 112/74 12/31/20 0824  Temp 36.4 C 12/31/20 0824  Pulse 86 12/31/20 0827  Resp 14 12/31/20 0827  SpO2 95 % 12/31/20 0827  Vitals shown include unvalidated device data.  Last Pain:  Vitals:   12/31/20 0824  TempSrc:   PainSc: Asleep         Complications: No notable events documented.

## 2020-12-31 NOTE — Discharge Instructions (Signed)

## 2020-12-31 NOTE — Anesthesia Procedure Notes (Signed)
Procedure Name: Intubation Date/Time: 12/31/2020 7:41 AM Performed by: Mohammed Kindle, CRNA Pre-anesthesia Checklist: Patient identified, Emergency Drugs available, Suction available and Patient being monitored Patient Re-evaluated:Patient Re-evaluated prior to induction Oxygen Delivery Method: Circle system utilized Preoxygenation: Pre-oxygenation with 100% oxygen Induction Type: IV induction Ventilation: Mask ventilation without difficulty Laryngoscope Size: McGraph and 3 Grade View: Grade I Tube type: Oral Tube size: 7.0 mm Number of attempts: 1 Airway Equipment and Method: Stylet and Oral airway Placement Confirmation: ETT inserted through vocal cords under direct vision, positive ETCO2, breath sounds checked- equal and bilateral and CO2 detector Secured at: 21 cm Tube secured with: Tape Dental Injury: Teeth and Oropharynx as per pre-operative assessment

## 2020-12-31 NOTE — Op Note (Signed)
Operative Note  Preoperative Diagnosis: Persistent anal fissure, perineal candidiasis  Postoperative Diagnosis: Same  Operation: Examination under anesthesia (diagnostic anoscopy), Botox chemodenervation of anal sphincter  Surgeon: [name], MD  Assistant: None  Anesthesia: General endotracheal  Findings: Extensive inflammation and irritation of the natal cleft and perineum secondary to apparent candidiasis.  Anterior anal fissure.  Indications: Bradley Douglas is a 31 year old man who had anal pain and was found to have an anterior anal fissure.  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  His fissure fails to respond to topical agents and Botox injection was offered to try and help heal the fissure without resorting to sphincterotomy.  The risks of the procedure were discussed with him in detail and he agreed to proceed.  Procedure In Detail: The patient was identified in the preoperative holding area and brought to the operating room.  He was intubated on his stretcher and then turned prone onto the OR table.  His arms were supported on the operating arm boards.  Care was taken to appropriately pad all bony prominences.  His body was supported on a purpose-made chest support.  The table was then flexed and the buttocks taped laterally to provide good exposure.  He was prepped and draped in standard fashion.  A timeout was performed confirming his identity, the procedure being performed, his allergies, all necessary equipment was available, and that maintenance anesthesia was adequate.  A lubricated Hill-Ferguson anoscopic retractor was placed into the patient's anus.  Circumferential evaluation of the anal canal demonstrated an anterior anal fissure.  The skin surrounding the anus was red and inflamed secondary to presumed candidiasis.  No other significant lesions were appreciated.  The internal anal sphincter was palpated and a total of 50 units of botulinum toxin in saline  were injected.  The cardinal complex points as well as locations on each side of the fissure were injected.  The patient was then returned to the supine position on his stretcher where he was safely awakened, extubated, and taken to the postanesthesia care unit in good condition.  EBL: Less than 1 cc  IVF: See anesthesia record  Specimen(s): None  Complications: none immediately apparent.  None  Counts: all needles, instruments, and sponges were counted and reported to be correct in number at the end of the case.   I was present for and participated in the entire operation.  [name] 8:46 AM

## 2021-01-05 NOTE — Anesthesia Postprocedure Evaluation (Signed)
Anesthesia Post Note  Patient: DAMANY EASTMAN  Procedure(s) Performed: EXAM UNDER ANESTHESIA BOTOX INJECTION into internal sphincter  Patient location during evaluation: PACU Anesthesia Type: General Level of consciousness: awake and alert Pain management: pain level controlled Vital Signs Assessment: post-procedure vital signs reviewed and stable Respiratory status: spontaneous breathing, nonlabored ventilation, respiratory function stable and patient connected to nasal cannula oxygen Cardiovascular status: blood pressure returned to baseline and stable Postop Assessment: no apparent nausea or vomiting Anesthetic complications: no   No notable events documented.   Last Vitals:  Vitals:   12/31/20 0900 12/31/20 0911  BP: 112/82 (!) 135/99  Pulse: 88 71  Resp: 13 14  Temp: 36.4 C (!) 36.2 C  SpO2: 97% 100%    Last Pain:  Vitals:   12/31/20 0911  TempSrc: Temporal  PainSc: 0-No pain                 Yevette Edwards

## 2021-01-13 ENCOUNTER — Encounter: Payer: Self-pay | Admitting: General Surgery

## 2021-01-13 ENCOUNTER — Other Ambulatory Visit: Payer: Self-pay

## 2021-01-13 ENCOUNTER — Ambulatory Visit (INDEPENDENT_AMBULATORY_CARE_PROVIDER_SITE_OTHER): Payer: 59 | Admitting: General Surgery

## 2021-01-13 VITALS — BP 124/86 | HR 89 | Temp 98.5°F | Ht 70.0 in | Wt 217.2 lb

## 2021-01-13 DIAGNOSIS — K602 Anal fissure, unspecified: Secondary | ICD-10-CM

## 2021-01-13 NOTE — Progress Notes (Signed)
Bradley Douglas is here today for a postoperative visit.  He is a 31 year old man who had an anal fissure that failed to respond to topical therapy.  He underwent examination under anesthesia and Botox chemodenervation of the internal anal sphincter on December 31, 2020.  He states that initially, he did not notice much difference, but within the last week or so, he has noticed a significant improvement in the spasms that typically occurred with bowel function as well as decreased pain with urination and ejaculation.  He has had some episodes of fecal incontinence, but he works from home and has not had any significant social issues as a result.  He also saw a dermatologist in the interim for his perineal candidiasis and is receiving treatment for that seems to be clearing this up.  Today's Vitals   01/13/21 0905  BP: 124/86  Pulse: 89  Temp: 98.5 F (36.9 C)  TempSrc: Oral  SpO2: 98%  Weight: 217 lb 3.2 oz (98.5 kg)  Height: 5\' 10"  (1.778 m)   Body mass index is 31.16 kg/m. No physical exam was performed today secondary to the nature of his intervention; there is no surgical site, per se, to inspect.  Overall, he is doing well and seems to be receiving the desired benefit from the Botox injection.  He was reassured that with time, the episodes of fecal incontinence should abate.  I explained to him that the injections were designed to allow his fissure to heal, but also to, in essence, reset his pelvic floor function.  This does seem to be happening and he is overall pleased with the result.  I will see him on an as-needed basis.

## 2021-01-13 NOTE — Patient Instructions (Signed)
Please call if you have any questions or concerns.  °

## 2021-02-23 ENCOUNTER — Encounter: Payer: Self-pay | Admitting: General Surgery

## 2022-05-25 IMAGING — CT CT NECK W/ CM
2 of 3 series · 8 of 14 positions shown, 9 images · IV contrast (omnipaque)
Comparison: Chest CT concurrently performed and separately reported
12/01/2019

CLINICAL DATA: Hemoptysis. Additional history provided: Patient
reports cough for 1.5 years, hemoptysis which began 2 months ago,
some shortness of breath, pain to left ear and lower throat area
with some difficulty swallowing intermittently.

EXAM:
CT NECK WITH CONTRAST
TECHNIQUE: Multidetector CT imaging of the neck was performed using the
standard protocol following the bolus administration of intravenous
contrast.
CONTRAST:  75mL OMNIPAQUE IOHEXOL 300 MG/ML  SOLN

[Series 3: axial neck · axial · 0.54mm/px · z∈[-216,-66]mm · 4 of 125 slices shown]
[im 25/125  bone]
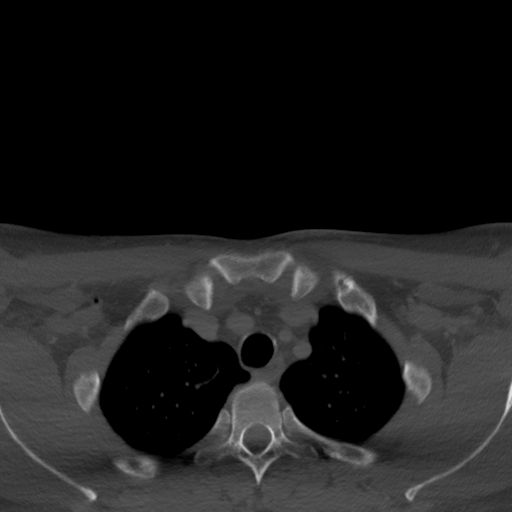
[im 50/125  bone]
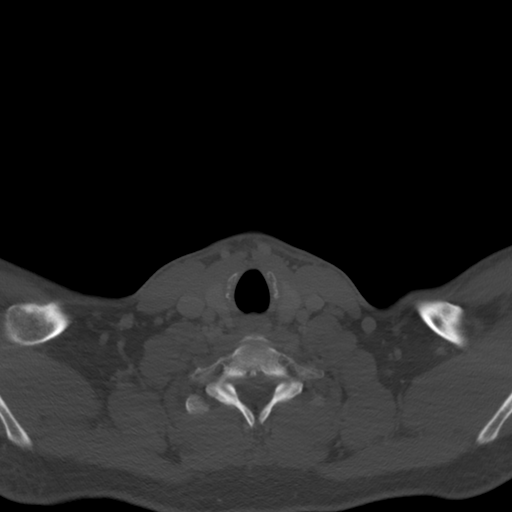
[im 75/125  bone]
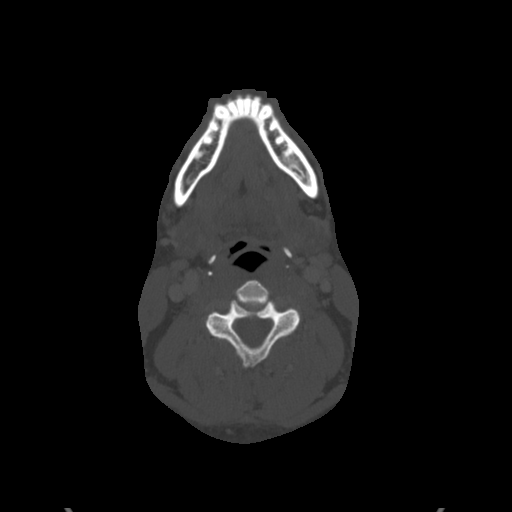
[im 100/125  bone]
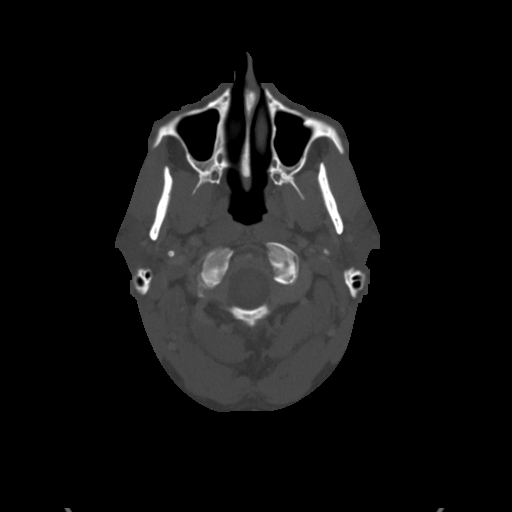

[Series 8: orthogonal ax · axial · 0.47mm/px · z∈[-259,-101]mm · 4 of 144 slices shown, 5 images]
[im 29/144  soft-tissue]
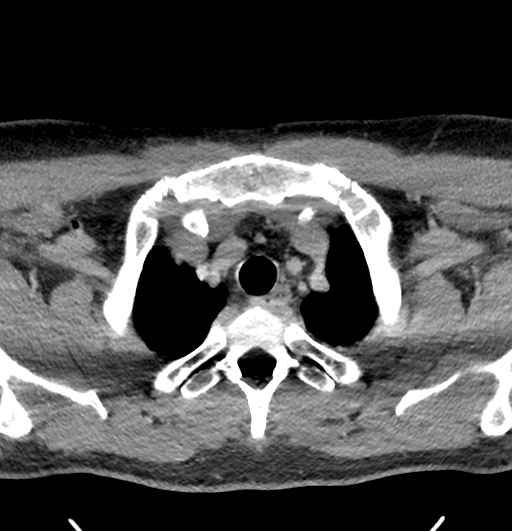
[im 29/144  bone]
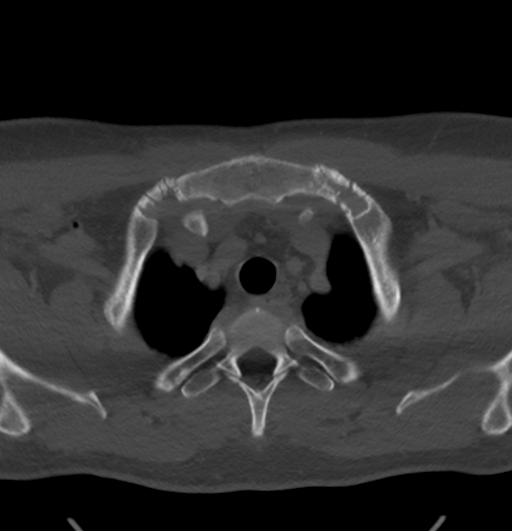
[im 58/144  bone]
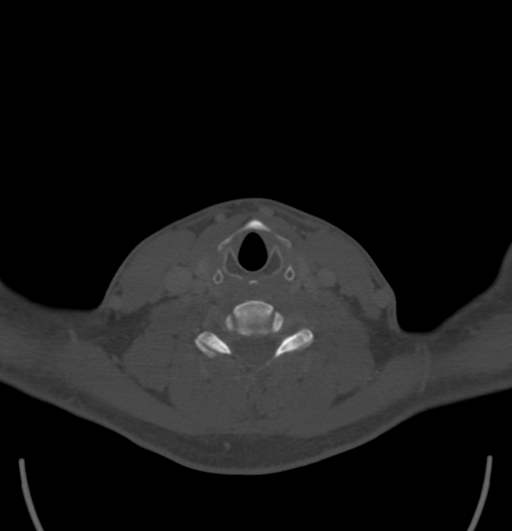
[im 86/144  bone]
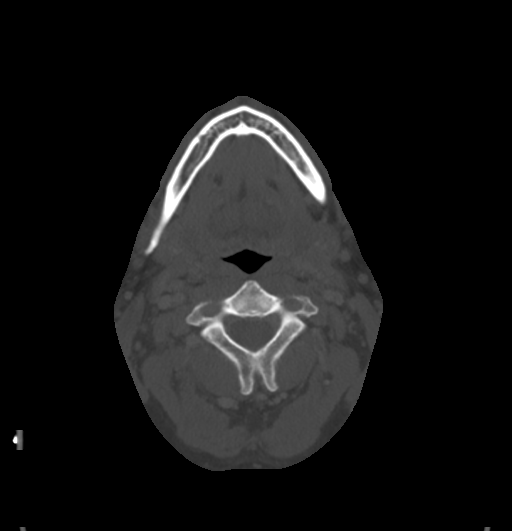
[im 115/144  bone]
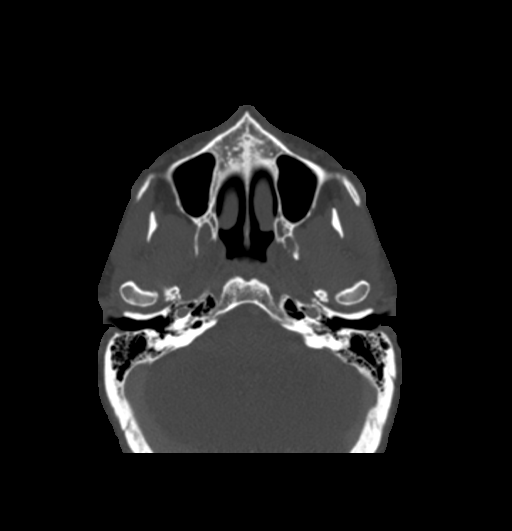

[8 of 14 positions shown; findings below may reference images not displayed]

FINDINGS: Pharynx and larynx: There is no appreciable swelling or discrete
mass within the oral cavity, pharynx or larynx. Postinflammatory
calcifications within the right palatine tonsil.

Salivary glands: No inflammation, mass, or stone.

Thyroid: Unremarkable.

Lymph nodes: No pathologically enlarged cervical chain lymph nodes.

Vascular: The major vascular structures of the neck are patent.

Limited intracranial: No abnormality identified.

Visualized orbits: Visualized orbits show no acute finding.

Mastoids and visualized paranasal sinuses: No significant paranasal
sinus disease or mastoid effusion at the imaged levels.

Skeleton: No acute bony abnormality or aggressive osseous lesion.

Upper chest: Separately reported. No airspace consolidation at the
imaged levels.
IMPRESSION: Essentially unremarkable examination. No soft tissue neck mass or
pathologically enlarged cervical chain lymph nodes are identified.

## 2022-11-15 ENCOUNTER — Other Ambulatory Visit: Payer: Self-pay | Admitting: Family Medicine

## 2022-11-15 DIAGNOSIS — R945 Abnormal results of liver function studies: Secondary | ICD-10-CM

## 2022-11-22 ENCOUNTER — Ambulatory Visit
Admission: RE | Admit: 2022-11-22 | Discharge: 2022-11-22 | Disposition: A | Discharge status: Home / Self Care | Payer: 59 | Source: Ambulatory Visit | Attending: Family Medicine | Admitting: Family Medicine

## 2022-11-22 DIAGNOSIS — R945 Abnormal results of liver function studies: Secondary | ICD-10-CM

## 2023-02-10 ENCOUNTER — Encounter: Payer: Self-pay | Admitting: Pulmonary Disease

## 2023-11-14 ENCOUNTER — Encounter (HOSPITAL_BASED_OUTPATIENT_CLINIC_OR_DEPARTMENT_OTHER): Payer: Self-pay

## 2023-11-14 ENCOUNTER — Emergency Department (HOSPITAL_BASED_OUTPATIENT_CLINIC_OR_DEPARTMENT_OTHER)

## 2023-11-14 ENCOUNTER — Other Ambulatory Visit: Payer: Self-pay

## 2023-11-14 ENCOUNTER — Emergency Department (HOSPITAL_BASED_OUTPATIENT_CLINIC_OR_DEPARTMENT_OTHER): Admission: EM | Admit: 2023-11-14 | Discharge: 2023-11-14 | Disposition: A | Discharge status: Home / Self Care

## 2023-11-14 DIAGNOSIS — R519 Headache, unspecified: Secondary | ICD-10-CM | POA: Insufficient documentation

## 2023-11-14 DIAGNOSIS — R110 Nausea: Secondary | ICD-10-CM | POA: Diagnosis not present

## 2023-11-14 DIAGNOSIS — R202 Paresthesia of skin: Secondary | ICD-10-CM | POA: Insufficient documentation

## 2023-11-14 DIAGNOSIS — R109 Unspecified abdominal pain: Secondary | ICD-10-CM | POA: Diagnosis not present

## 2023-11-14 DIAGNOSIS — R200 Anesthesia of skin: Secondary | ICD-10-CM | POA: Diagnosis not present

## 2023-11-14 LAB — LIPASE, BLOOD: Lipase: 34 U/L (ref 11–51)

## 2023-11-14 LAB — COMPREHENSIVE METABOLIC PANEL WITH GFR
ALT: 74 U/L — ABNORMAL HIGH (ref 0–44)
AST: 38 U/L (ref 15–41)
Albumin: 4.5 g/dL (ref 3.5–5.0)
Alkaline Phosphatase: 95 U/L (ref 38–126)
Anion gap: 10 (ref 5–15)
BUN: 8 mg/dL (ref 6–20)
CO2: 27 mmol/L (ref 22–32)
Calcium: 9.2 mg/dL (ref 8.9–10.3)
Chloride: 100 mmol/L (ref 98–111)
Creatinine, Ser: 1.09 mg/dL (ref 0.61–1.24)
GFR, Estimated: >60 mL/min (ref >60)
Glucose, Bld: 86 mg/dL (ref 70–99)
Potassium: 4.2 mmol/L (ref 3.5–5.1)
Sodium: 137 mmol/L (ref 135–145)
Total Bilirubin: 1 mg/dL (ref 0.0–1.2)
Total Protein: 7 g/dL (ref 6.5–8.1)

## 2023-11-14 LAB — CBC
HCT: 47.3 % (ref 39.0–52.0)
Hemoglobin: 17.4 g/dL — ABNORMAL HIGH (ref 13.0–17.0)
MCH: 33.5 pg (ref 26.0–34.0)
MCHC: 36.8 g/dL — ABNORMAL HIGH (ref 30.0–36.0)
MCV: 91.1 fL (ref 80.0–100.0)
Platelets: 246 10*3/uL (ref 150–400)
RBC: 5.19 MIL/uL (ref 4.22–5.81)
RDW: 12 % (ref 11.5–15.5)
WBC: 5 10*3/uL (ref 4.0–10.5)
nRBC: 0 % (ref 0.0–0.2)

## 2023-11-14 LAB — TROPONIN T, HIGH SENSITIVITY: Troponin T High Sensitivity: <15 ng/L (ref <19)

## 2023-11-14 MED ORDER — PROCHLORPERAZINE EDISYLATE 10 MG/2ML IJ SOLN
10.0000 mg | Freq: Once | INTRAMUSCULAR | Status: AC
  Administered 2023-11-14: 10 mg via INTRAVENOUS
  Filled 2023-11-14: qty 2

## 2023-11-14 MED ORDER — LACTATED RINGERS IV BOLUS
1000.0000 mL | Freq: Once | INTRAVENOUS | Status: AC
  Administered 2023-11-14: 1000 mL via INTRAVENOUS

## 2023-11-14 MED ORDER — DIPHENHYDRAMINE HCL 50 MG/ML IJ SOLN
25.0000 mg | Freq: Once | INTRAMUSCULAR | Status: AC
  Administered 2023-11-14: 25 mg via INTRAVENOUS
  Filled 2023-11-14: qty 1

## 2023-11-14 NOTE — Discharge Instructions (Signed)
 Your workup today was reassuring.  I think that your symptoms were likely due to a complex migraine.  I have placed a neurology referral.  You should receive a call in the next few days regarding follow-up.  If you develop recurrent headache please take Tylenol  or ibuprofen .  Your labs and CT scan today were reassuring.  Please return to the emergency department for worsening symptoms.

## 2023-11-14 NOTE — ED Notes (Signed)
 Dr. Charlee Conine in room to assess patient.

## 2023-11-14 NOTE — ED Provider Notes (Signed)
 [place] EMERGENCY DEPARTMENT AT [facility] Provider Note   CSN: [id number] Arrival date & time: 11/14/23  4098     History  Chief Complaint  Patient presents with   Headache    Bradley Douglas is a 33 y.o. male.  33 year old male presenting to the emergency department today with headache, right sided numbness/tingling, and headache.  The patient states that this started around an hour ago.  The patient states he walked his dog when he started to develop some mild abdominal discomfort with some nausea.  He states that he started having paresthesias on the right arm and leg.  States that he developed a gradual onset headache that is global in character shortly after.  The patient denies any vomiting.  Denies any chest pain but states that he did feel slightly short of breath when this started.  He states that the symptoms have improved he still having some symptoms so he came to the ER for further evaluation.  He denies any history of headaches similar to this.  Headache is moderate in intensity.   Headache Associated symptoms: numbness        Home Medications Prior to Admission medications   Medication Sig Start Date End Date Taking? Authorizing Provider  buPROPion (WELLBUTRIN XL) 150 MG 24 hr tablet Take 150 mg by mouth daily. 12/05/19  Yes [provider]  buPROPion (WELLBUTRIN XL) 300 MG 24 hr tablet Take 300 mg by mouth every morning. 08/30/23  Yes [provider]  doxycycline (VIBRA-TABS) 100 MG tablet Take by mouth. 11/08/23  Yes [provider]  emtricitabine-tenofovir (TRUVADA) 200-300 MG tablet Take 1 tablet by mouth daily. 11/08/23  Yes [provider]  Methylphenidate HCl ER, PM, (JORNAY PM) 40 MG CP24 Take 40 mg by mouth at bedtime.   Yes [provider]  olopatadine (PATANOL) 0.1 % ophthalmic solution Place 1 drop into both eyes 2 (two) times daily as needed for allergies.   Yes [provider]  triamcinolone  (NASACORT) 55 MCG/ACT AERO nasal inhaler Place 2 sprays into the nose daily as needed (allergies).   Yes [provider]      Allergies    Amoxicillin    Review of Systems   Review of Systems  Neurological:  Positive for numbness and headaches.  All other systems reviewed and are negative.   Physical Exam Updated Vital Signs BP 127/84 (BP Location: Right Arm)   Pulse 83   Temp 98.3 F (36.8 C) (Oral)   Resp 16   Ht 5' 10.5 (1.791 m)   Wt 88 kg   SpO2 100%   BMI 27.44 kg/m  Physical Exam Vitals and nursing note reviewed.   Gen: NAD Eyes: PERRL, EOMI HEENT: no oropharyngeal swelling Neck: trachea midline Resp: clear to auscultation bilaterally Card: RRR, no murmurs, rubs, or gallops Abd: nontender, nondistended Extremities: no calf tenderness, no edema Vascular: 2+ radial pulses bilaterally, 2+ DP pulses bilaterally Neuro: NIH stroke scale of 0.  The patient has equal strength 5 out of 5 strength in the upper and lower extremities with normal cranial nerve exam.  He reports equal sensation throughout the upper and lower extremities to light touch Skin: no rashes Psyc: acting appropriately   ED Results / Procedures / Treatments   Labs (all labs ordered are listed, but only abnormal results are displayed) Labs Reviewed  CBC - Abnormal; Notable for the following components:      Result Value   Hemoglobin 17.4 (*)  MCHC 36.8 (*)    All other components within normal limits  COMPREHENSIVE METABOLIC PANEL WITH GFR - Abnormal; Notable for the following components:   ALT 74 (*)    All other components within normal limits  LIPASE, BLOOD  TROPONIN T, HIGH SENSITIVITY  TROPONIN T, HIGH SENSITIVITY    EKG EKG Interpretation Date/Time:  Wednesday November 14 2023 09:06:16 EDT Ventricular Rate:  79 PR Interval:    QRS Duration:  83 QT Interval:  353 QTC Calculation: 405 R Axis:   64  Text Interpretation: Normal sinus rhythm Baseline artifact, nonspecific  ST-T changes Confirmed by Abner Hoffman [id number]) on 11/14/2023 9:34:36 AM  Radiology DG Chest 2 View Result Date: 11/14/2023 CLINICAL DATA:  Shortness of breath. EXAM: CHEST - 2 VIEW COMPARISON:  11/19/2019. FINDINGS: The heart size and mediastinal contours are within normal limits. Both lungs are clear. No pleural effusion or pneumothorax. The visualized skeletal structures are unremarkable. IMPRESSION: No acute cardiopulmonary findings. Electronically Signed   By: Mannie Seek M.D.   On: 11/14/2023 10:25   CT Head Wo Contrast Result Date: 11/14/2023 CLINICAL DATA:  Headache, increasing frequency or severity EXAM: CT HEAD WITHOUT CONTRAST TECHNIQUE: Contiguous axial images were obtained from the base of the skull through the vertex without intravenous contrast. RADIATION DOSE REDUCTION: This exam was performed according to the departmental dose-optimization program which includes automated exposure control, adjustment of the mA and/or kV according to patient size and/or use of iterative reconstruction technique. COMPARISON:  None Available. FINDINGS: Brain: Normal. No evidence of hemorrhage, mass, infarct or hydrocephalus. Vascular: Normal. Skull: Intact and unremarkable. Sinuses/Orbits: Unremarkable. Other: None. IMPRESSION: Normal brain. Electronically Signed   By: Maribeth Shivers M.D.   On: 11/14/2023 10:17    Procedures Procedures    Medications Ordered in ED Medications  lactated ringers  bolus 1,000 mL (1,000 mLs Intravenous New Bag/Given 11/14/23 0928)  prochlorperazine (COMPAZINE) injection 10 mg (10 mg Intravenous Given 11/14/23 0928)  diphenhydrAMINE (BENADRYL) injection 25 mg (25 mg Intravenous Given 11/14/23 9629)    ED Course/ Medical Decision Making/ A&P                                 Medical Decision Making 33 year old male presenting to the emergency department today with headache and paresthesias on the right side.  Despite this complaint the patient has a reassuring  neurologic exam and does not have any objective findings here on exam.  He does not appear to have any debilitating symptoms.  Suspect this is likely due to complex migraine given the headache with the symptoms.  Will obtain a CT scan to eval for intracranial abnormalities.  Will also obtain basic labs to eval for anemia or electrolyte abnormalities.  Will obtain LFTs and a lipase given his abdominal discomfort but his abdominal exam is reassuring.  I will give the patient Compazine and Benadryl as well as IV fluids and reevaluate.  If his symptoms worsen we will consider initiating a code stroke but given the NIH stroke scale of 0 at this time we will hold off on calling a code stroke at this time.  The patient's labs are reassuring.  CT scan is unremarkable.  The patient is feeling better on reassessment and no longer has any paresthesias or headache.  Strongly suspect that this is due to complex migraine headache.  The patient has been ambulatory here without difficulty.  I think that he is stable for  discharge.  Amount and/or Complexity of Data Reviewed Labs: ordered. Radiology: ordered.  Risk Prescription drug management.           Final Clinical Impression(s) / ED Diagnoses Final diagnoses:  Nonintractable headache, unspecified chronicity pattern, unspecified headache type  Paresthesia    Rx / DC Orders ED Discharge Orders          Ordered    Ambulatory referral to Neurology       Comments: An appointment is requested in approximately: 4 weeks   11/14/23 1053              Carin Charleston, MD 11/14/23 1054

## 2023-11-14 NOTE — ED Triage Notes (Signed)
 In for eval of sudden onset of abd bloating, tingling and numbness to right arm/ right leg/chest, headache just after walking his dog this am at 0800. Reports some dizziness with standing but none at rest. No weakness or numbness noted on exam. Reports symptoms have lessened since arrival to ED.

## 2024-03-11 NOTE — Progress Notes (Signed)
    Assessment & Plan:  1. SOB (shortness of breath) (Primary) - Pulmonary Function Test [facility] Only; Future  2. Orthopnea - ECHOCARDIOGRAM COMPLETE; Future - Pulmonary Function Test [facility] Only; Future  3. Hemoptysis, minor, streaky Comments: Negative CT chest   4. Tachycardia - ECHOCARDIOGRAM COMPLETE; Future - Pulmonary Function Test [facility] Only; Future - Ambulatory referral to Cardiology - LONG TERM MONITOR (3-14 DAYS); Future  5. Gastroesophageal reflux disease, unspecified whether esophagitis present   Patient Instructions  We are going to investigate with an event monitor (it used to be called a Holter monitor) to see what is going on with your heart rate and other issues.  Also I am going to check an echocardiogram.  We are going to get also a breathing tests.  Going to start you on medication for reflux.   We will see you back in 2 to 3 weeks time call sooner should any new difficulties arise.    Please note: late entry documentation due to logistical difficulties during COVID-19 pandemic. This note is filed for information purposes only, and is not intended to be used for billing, nor does it represent the full scope/nature of the visit in question. Please see any associated scanned media linked to date of encounter for additional pertinent information.  Subjective:    HPI: Bradley Douglas is a 33 y.o. male presenting to the pulmonology clinic on 12/24/2019 with report of: pulmonary consult (per Delon Sprang, PA--prod cough with clear to white mucus occ mixed bright red, sweats and sob that worsens with laying flat on back. )     Outpatient Encounter Medications as of 12/24/2019  Medication Sig   buPROPion (WELLBUTRIN XL) 150 MG 24 hr tablet Take 150 mg by mouth daily.   [DISCONTINUED] esomeprazole  (NEXIUM ) 40 MG capsule Take 1 capsule (40 mg total) by mouth daily.   [DISCONTINUED] lisdexamfetamine (VYVANSE) 60 MG capsule Take 1 capsule by mouth daily.   No  facility-administered encounter medications on file as of 12/24/2019.      Objective:   Vitals:   12/24/19 1142  BP: 124/70  Pulse: (!) 130  Temp: 98.4 F (36.9 C)  Height: 5' 10 (1.778 m)  Weight: 194 lb 9.6 oz (88.3 kg)  SpO2: 98%  TempSrc: Temporal  BMI (Calculated): 27.92     Physical exam documentation is limited by delayed entry of information.

## 2024-04-22 ENCOUNTER — Encounter: Payer: Self-pay | Admitting: Oncology

## 2024-04-22 ENCOUNTER — Inpatient Hospital Stay

## 2024-04-22 ENCOUNTER — Inpatient Hospital Stay: Attending: Oncology | Admitting: Oncology

## 2024-04-22 VITALS — BP 106/84 | HR 78 | Temp 98.3°F | Resp 16 | Ht 70.0 in | Wt 201.7 lb

## 2024-04-22 DIAGNOSIS — Z79899 Other long term (current) drug therapy: Secondary | ICD-10-CM | POA: Insufficient documentation

## 2024-04-22 DIAGNOSIS — Z8349 Family history of other endocrine, nutritional and metabolic diseases: Secondary | ICD-10-CM | POA: Insufficient documentation

## 2024-04-22 DIAGNOSIS — Z806 Family history of leukemia: Secondary | ICD-10-CM | POA: Diagnosis not present

## 2024-04-22 DIAGNOSIS — D751 Secondary polycythemia: Secondary | ICD-10-CM | POA: Diagnosis not present

## 2024-04-22 DIAGNOSIS — Z148 Genetic carrier of other disease: Secondary | ICD-10-CM | POA: Insufficient documentation

## 2024-04-22 DIAGNOSIS — Z79624 Long term (current) use of inhibitors of nucleotide synthesis: Secondary | ICD-10-CM | POA: Insufficient documentation

## 2024-04-22 DIAGNOSIS — Z807 Family history of other malignant neoplasms of lymphoid, hematopoietic and related tissues: Secondary | ICD-10-CM | POA: Insufficient documentation

## 2024-04-22 DIAGNOSIS — F418 Other specified anxiety disorders: Secondary | ICD-10-CM | POA: Insufficient documentation

## 2024-04-22 LAB — CMP (CANCER CENTER ONLY)
ALT: 48 U/L — ABNORMAL HIGH (ref 0–44)
AST: 28 U/L (ref 15–41)
Albumin: 4.5 g/dL (ref 3.5–5.0)
Alkaline Phosphatase: 90 U/L (ref 38–126)
Anion gap: 8 (ref 5–15)
BUN: 10 mg/dL (ref 6–20)
CO2: 30 mmol/L (ref 22–32)
Calcium: 9.9 mg/dL (ref 8.9–10.3)
Chloride: 101 mmol/L (ref 98–111)
Creatinine: 0.92 mg/dL (ref 0.61–1.24)
GFR, Estimated: >60 mL/min (ref >60)
Glucose, Bld: 81 mg/dL (ref 70–99)
Potassium: 4.4 mmol/L (ref 3.5–5.1)
Sodium: 139 mmol/L (ref 135–145)
Total Bilirubin: 0.9 mg/dL (ref 0.0–1.2)
Total Protein: 7.8 g/dL (ref 6.5–8.1)

## 2024-04-22 LAB — CBC WITH DIFFERENTIAL (CANCER CENTER ONLY)
Abs Immature Granulocytes: 0.01 K/uL (ref 0.00–0.07)
Basophils Absolute: 0 K/uL (ref 0.0–0.1)
Basophils Relative: 1 %
Eosinophils Absolute: 0.2 K/uL (ref 0.0–0.5)
Eosinophils Relative: 3 %
HCT: 49.7 % (ref 39.0–52.0)
Hemoglobin: 18.4 g/dL — ABNORMAL HIGH (ref 13.0–17.0)
Immature Granulocytes: 0 %
Lymphocytes Relative: 43 %
Lymphs Abs: 2.3 K/uL (ref 0.7–4.0)
MCH: 32.9 pg (ref 26.0–34.0)
MCHC: 37 g/dL — ABNORMAL HIGH (ref 30.0–36.0)
MCV: 88.8 fL (ref 80.0–100.0)
Monocytes Absolute: 0.3 K/uL (ref 0.1–1.0)
Monocytes Relative: 5 %
Neutro Abs: 2.6 K/uL (ref 1.7–7.7)
Neutrophils Relative %: 48 %
Platelet Count: 285 K/uL (ref 150–400)
RBC: 5.6 MIL/uL (ref 4.22–5.81)
RDW: 10.9 % — ABNORMAL LOW (ref 11.5–15.5)
WBC Count: 5.4 K/uL (ref 4.0–10.5)
nRBC: 0 % (ref 0.0–0.2)

## 2024-04-22 LAB — LACTATE DEHYDROGENASE: LDH: 171 U/L (ref 105–235)

## 2024-04-22 LAB — IRON AND TIBC
Iron: 102 ug/dL (ref 45–182)
Saturation Ratios: 29 % (ref 17.9–39.5)
TIBC: 347 ug/dL (ref 250–450)
UIBC: 245 ug/dL

## 2024-04-22 LAB — FERRITIN: Ferritin: 160 ng/mL (ref 24–336)

## 2024-04-22 LAB — SEDIMENTATION RATE: Sed Rate: 4 mm/h (ref 0–16)

## 2024-04-22 LAB — C-REACTIVE PROTEIN: CRP: 0.6 mg/dL (ref <1.0)

## 2024-04-22 NOTE — Assessment & Plan Note (Signed)
 His mother apparently is a carrier of hemochromatosis.  Unknown gene mutation type.  Will proceed with HFE gene mutation analysis.  Iron studies and ferritin are within normal limits today

## 2024-04-22 NOTE — Progress Notes (Signed)
 [facility]  HEMATOLOGY CLINIC CONSULTATION NOTE    PATIENT NAME: Bradley Douglas   MR#: [id number] DOB: 23-Dec-1990  DATE OF SERVICE: 04/22/2024   REFERRING PHYSICIAN  Chrystal Lamarr RAMAN, MD   Patient Care Team: Chrystal Lamarr RAMAN, MD as PCP - General (Family Medicine) Darliss Rogue, MD as PCP - Cardiology (Cardiology)    REASON FOR CONSULTATION/ CHIEF COMPLAINT: Polycythemia  ASSESSMENT & PLAN:  Bradley Douglas is a 33 y.o. gentleman with past medical history of Migraines, GERD, ADHD, Anxiety, was referred to our clinic for evaluation of polycythemia.  Polycythemia Chronic polycythemia with elevated hemoglobin levels (18 in June, 17.8 in September) and hematocrit levels (50 in June, 51.7 in September).   Differential diagnoses considered included low oxygen levels, erythropoietin-producing tumor, or JAK2 mutation. No smoking, high altitude exposure, or testosterone use. Family history of leukemia and multiple myeloma. No personal history of blood clots or significant bleeding disorders. No sleep apnea diagnosis.  Discussed potential complications of untreated polycythemia, including thrombosis, stroke-like symptoms, and cardiovascular issues.   Explained that if JAK2 mutation is positive, hematocrit should be maintained below 45 to prevent complications. If negative, hematocrit should be below 55.  Labs today showed hemoglobin of 18.4, hematocrit 49.7.  White count and platelet count are within normal limits.  CMP grossly unremarkable.  Iron studies, ESR, CRP, LDH are all within normal limits.  We did check erythropoietin level, JAK2 mutation test for further analysis with reflex testing.  - Will arrange phone call visit in two weeks to discuss results.  - Plan follow-up visit in three months.  Family history of hemochromatosis His mother apparently is a carrier of hemochromatosis.  Unknown gene mutation type.  Will proceed with HFE  gene mutation analysis.  Iron studies and ferritin are within normal limits today  Elevated liver enzymes Chronic elevation of liver enzymes, specifically ALT, with a value of 74 in June (normal <44). Occasional alcohol consumption and rare use of acetaminophen . Discontinued Truvada due to concerns about liver enzyme elevation. No significant alcohol use or acetaminophen  use to explain elevation. - Continue to monitor liver enzyme levels.    I reviewed lab results and outside records for this visit and discussed relevant results with the patient. Diagnosis, plan of care and treatment options were also discussed in detail with the patient. Opportunity provided to ask questions and answers provided to his apparent satisfaction. Provided instructions to call our clinic with any problems, questions or concerns prior to return visit. I recommended to continue follow-up with PCP and sub-specialists. He verbalized understanding and agreed with the plan. No barriers to learning was detected.  Chinita Patten, MD  04/22/2024 3:24 PM  [facility] - A DEPT OF [facility] [address] [place] [address] Dept: [phone] Dept Fax: [phone]   HISTORY OF PRESENTING ILLNESS:   Discussed the use of AI scribe software for clinical note transcription with the patient, who gave verbal consent to proceed.  History of Present Illness Bradley Douglas is a 33 year old male who presents with elevated liver enzymes and hemoglobin. He was referred by his primary care doctor for evaluation of elevated hemoglobin levels.  He has had elevated liver enzymes and hemoglobin levels for the past year. Three sets of blood work over this period showed hemoglobin levels of 18 in June and 17.8 in September, both above the normal range of 13 to 17. His white and  red blood cell counts have remained normal. No history of smoking, tobacco use, or  testosterone supplements. He works from home in [employer] and does not work in environments with low oxygen levels such as basements or high altitudes.  He has experienced elevated liver enzymes continuously over the past year, with an ALT of 74 in June, above the normal range of up to 44. He drinks alcohol occasionally, about once or twice a month socially, and rarely uses Tylenol , only for severe headaches. He was previously on Truvada for HIV prevention but discontinued it due to concerns about liver enzyme elevation.  There is a family history of hemochromatosis, with his mother having one gene for the condition. His grandfather had leukemia, and his grandmother had multiple myeloma. There is also a family history of Von Willebrand disease, but he has not experienced any significant bleeding issues himself.  He reports occasional snoring but generally sleeps well and feels refreshed upon waking. He has experienced headaches, particularly after prolonged screen time, and had his first migraine in June, which included right-sided numbness. He has had one other migraine since then, which was less severe and resolved quickly. No history of blood clots, chest congestion, or significant swelling in the legs, although he notes occasional mild swelling in the stomach area.    MEDICAL HISTORY:  Past Medical History:  Diagnosis Date   ADHD 2015   Anxiety    Chronic otitis media of both ears    childhood   Depression    on treatment during '02-2012   GERD (gastroesophageal reflux disease)    History of kidney stones    Seasonal allergic rhinitis     SURGICAL HISTORY: Past Surgical History:  Procedure Laterality Date   BOTOX  INJECTION N/A 12/31/2020   Procedure: BOTOX  INJECTION into internal sphincter;  Surgeon: Marolyn Nest, MD;  Location: [facility] ORS;  Service: General;  Laterality: N/A;   ESOPHAGOGASTRODUODENOSCOPY ENDOSCOPY     SKIN LESION EXCISION     abd inner thigh back left  thigh   WISDOM TOOTH EXTRACTION      SOCIAL HISTORY: He reports that he has never smoked. He has never used smokeless tobacco. He reports current alcohol use of about 6.0 standard drinks of alcohol per week. He reports that he does not use drugs.  Social History   Socioeconomic History   Marital status: Single    Spouse name: Not on file   Number of children: Not on file   Years of education: Not on file   Highest education level: Not on file  Occupational History   Occupation: mortgage processor   Tobacco Use   Smoking status: Never   Smokeless tobacco: Never  Vaping Use   Vaping status: Never Used  Substance and Sexual Activity   Alcohol use: Yes    Alcohol/week: 6.0 standard drinks of alcohol    Types: 3 Cans of beer, 3 Shots of liquor per week    Comment: once a month   Drug use: No    Comment: delta 10 gummies   Sexual activity: Yes    Birth control/protection: Condom    Comment: Sexual partners: male   Other Topics Concern   Not on file  Social History Narrative   He works as a multimedia programmer.       Social Drivers of Corporate Investment Banker Strain: Not on file  Food Insecurity: No Food Insecurity (04/22/2024)   Hunger Vital Sign    Worried About Running Out  of Food in the Last Year: Never true    Ran Out of Food in the Last Year: Never true  Transportation Needs: No Transportation Needs (04/22/2024)   PRAPARE - Administrator, Civil Service (Medical): No    Lack of Transportation (Non-Medical): No  Physical Activity: Not on file  Stress: Not on file  Social Connections: Not on file  Intimate Partner Violence: Not on file    FAMILY HISTORY: Family History  Problem Relation Age of Onset   ADD / ADHD Mother    Mitral valve prolapse Mother    Cancer Maternal Grandfather        leukemia   Hypertension Other        paternal side   Cancer Maternal Grandmother    Heart disease Neg Hx    Diabetes Neg Hx    Stroke Neg Hx     CURRENT  MEDICATIONS   Current Outpatient Medications  Medication Instructions   buPROPion (WELLBUTRIN XL) 150 mg, Daily   buPROPion (WELLBUTRIN XL) 300 mg, Every morning   doxycycline (VIBRA-TABS) 100 MG tablet Take by mouth.   emtricitabine-tenofovir (TRUVADA) 200-300 MG tablet 1 tablet, Daily   Jornay PM 40 mg, Nightly   loratadine (CLARITIN) 10 mg, Daily PRN   SODIUM FLUORIDE 5000 PPM 1.1 % PSTE 1 Application, As directed   triamcinolone (NASACORT) 55 MCG/ACT AERO nasal inhaler 2 sprays, Daily PRN     ALLERGIES  He is allergic to amoxicillin.  REVIEW OF SYSTEMS:    Review of Systems - Oncology  All of the other pertinent systems were reviewed with the patient and were unremarkable except as mentioned above in HPI.  PHYSICAL EXAMINATION:   Onc Performance Status - 04/22/24 1002       ECOG Perf Status   ECOG Perf Status Fully active, able to carry on all pre-disease performance without restriction      KPS SCALE   KPS % SCORE Normal, no compliants, no evidence of disease          Vitals:   04/22/24 0954  BP: 106/84  Pulse: 78  Resp: 16  Temp: 98.3 F (36.8 C)  SpO2: 99%   Filed Weights   04/22/24 0954  Weight: 201 lb 11.2 oz (91.5 kg)    Physical Exam Constitutional:      General: He is not in acute distress.    Appearance: Normal appearance.  HENT:     Head: Normocephalic and atraumatic.  Eyes:     Conjunctiva/sclera: Conjunctivae normal.  Cardiovascular:     Rate and Rhythm: Normal rate and regular rhythm.  Pulmonary:     Effort: Pulmonary effort is normal. No respiratory distress.  Abdominal:     General: There is no distension.  Neurological:     General: No focal deficit present.     Mental Status: He is alert and oriented to person, place, and time.  Psychiatric:        Mood and Affect: Mood normal.        Behavior: Behavior normal.    LABORATORY DATA:   I have reviewed the data as listed.  Results for orders placed or performed in visit on  04/22/24  C-reactive protein  Result Value Ref Range   CRP 0.6 <1.0 mg/dL  Sedimentation rate  Result Value Ref Range   Sed Rate 4 0 - 16 mm/hr  Ferritin  Result Value Ref Range   Ferritin 160 24 - 336 ng/mL  Iron and TIBC  Result Value Ref Range   Iron 102 45 - 182 ug/dL   TIBC 652 749 - 549 ug/dL   Saturation Ratios 29 17.9 - 39.5 %   UIBC 245 ug/dL  Lactate dehydrogenase  Result Value Ref Range   LDH 171 105 - 235 U/L  CMP (Cancer Center only)  Result Value Ref Range   Sodium 139 135 - 145 mmol/L   Potassium 4.4 3.5 - 5.1 mmol/L   Chloride 101 98 - 111 mmol/L   CO2 30 22 - 32 mmol/L   Glucose, Bld 81 70 - 99 mg/dL   BUN 10 6 - 20 mg/dL   Creatinine 9.07 9.38 - 1.24 mg/dL   Calcium 9.9 8.9 - 89.6 mg/dL   Total Protein 7.8 6.5 - 8.1 g/dL   Albumin 4.5 3.5 - 5.0 g/dL   AST 28 15 - 41 U/L   ALT 48 (H) 0 - 44 U/L   Alkaline Phosphatase 90 38 - 126 U/L   Total Bilirubin 0.9 0.0 - 1.2 mg/dL   GFR, Estimated >39 >39 mL/min   Anion gap 8 5 - 15  CBC with Differential (Cancer Center Only)  Result Value Ref Range   WBC Count 5.4 4.0 - 10.5 K/uL   RBC 5.60 4.22 - 5.81 MIL/uL   Hemoglobin 18.4 (H) 13.0 - 17.0 g/dL   HCT 50.2 60.9 - 47.9 %   MCV 88.8 80.0 - 100.0 fL   MCH 32.9 26.0 - 34.0 pg   MCHC 37.0 (H) 30.0 - 36.0 g/dL   RDW 89.0 (L) 88.4 - 84.4 %   Platelet Count 285 150 - 400 K/uL   nRBC 0.0 0.0 - 0.2 %   Neutrophils Relative % 48 %   Neutro Abs 2.6 1.7 - 7.7 K/uL   Lymphocytes Relative 43 %   Lymphs Abs 2.3 0.7 - 4.0 K/uL   Monocytes Relative 5 %   Monocytes Absolute 0.3 0.1 - 1.0 K/uL   Eosinophils Relative 3 %   Eosinophils Absolute 0.2 0.0 - 0.5 K/uL   Basophils Relative 1 %   Basophils Absolute 0.0 0.0 - 0.1 K/uL   Immature Granulocytes 0 %   Abs Immature Granulocytes 0.01 0.00 - 0.07 K/uL     RADIOGRAPHIC STUDIES:  No recent pertinent imaging available to review.  Orders Placed This Encounter  Procedures   CBC with Differential (Cancer Center  Only)    Standing Status:   Future    Number of Occurrences:   1    Expiration Date:   04/22/2025   CMP (Cancer Center only)    Standing Status:   Future    Number of Occurrences:   1    Expiration Date:   04/22/2025   Lactate dehydrogenase    Standing Status:   Future    Number of Occurrences:   1    Expiration Date:   04/22/2025   Iron and TIBC    Standing Status:   Future    Number of Occurrences:   1    Expiration Date:   04/22/2025   Ferritin    Standing Status:   Future    Number of Occurrences:   1    Expiration Date:   04/22/2025   Sedimentation rate    Standing Status:   Future    Number of Occurrences:   1    Expiration Date:   04/22/2025   C-reactive protein    Standing Status:   Future    Number of  Occurrences:   1    Expiration Date:   04/22/2025   Erythropoietin    Standing Status:   Future    Number of Occurrences:   1    Expiration Date:   04/22/2025   JAK2 V617F rfx CALR/MPL/E12-15    Standing Status:   Future    Number of Occurrences:   1    Expiration Date:   04/22/2025   Hemochromatosis DNA, PCR    Standing Status:   Future    Number of Occurrences:   1    Expiration Date:   04/22/2025    Future Appointments  Date Time Provider Department Center  05/06/2024  3:45 PM Muhamad Serano, Chinita, MD [facility] None  07/21/2024 11:00 AM [facility] PHLEBOTOMIST [facility] None  07/21/2024 11:30 AM Paden Senger, Chinita, MD [facility] None     I spent a total of 55 minutes during this encounter with the patient including review of chart and various tests results, discussions about plan of care and coordination of care plan.   This document was completed utilizing speech recognition software. Grammatical errors, random word insertions, pronoun errors, and incomplete sentences are an occasional consequence of this system due to software limitations, ambient noise, and hardware issues. Any formal questions or concerns about the content, text or information contained within the body  of this dictation should be directly addressed to the provider for clarification.

## 2024-04-22 NOTE — Assessment & Plan Note (Addendum)
 Chronic polycythemia with elevated hemoglobin levels (18 in June, 17.8 in September) and hematocrit levels (50 in June, 51.7 in September).   Differential diagnoses considered included low oxygen levels, erythropoietin-producing tumor, or JAK2 mutation. No smoking, high altitude exposure, or testosterone use. Family history of leukemia and multiple myeloma. No personal history of blood clots or significant bleeding disorders. No sleep apnea diagnosis.  Discussed potential complications of untreated polycythemia, including thrombosis, stroke-like symptoms, and cardiovascular issues.   Explained that if JAK2 mutation is positive, hematocrit should be maintained below 45 to prevent complications. If negative, hematocrit should be below 55.  Labs today showed hemoglobin of 18.4, hematocrit 49.7.  White count and platelet count are within normal limits.  CMP grossly unremarkable.  Iron studies, ESR, CRP, LDH are all within normal limits.  We did check erythropoietin level, JAK2 mutation test for further analysis with reflex testing.  - Will arrange phone call visit in two weeks to discuss results.  - Plan follow-up visit in three months.

## 2024-04-24 LAB — ERYTHROPOIETIN: Erythropoietin: 9.6 m[IU]/mL (ref 2.6–18.5)

## 2024-04-28 LAB — HEMOCHROMATOSIS DNA-PCR(C282Y,H63D)

## 2024-05-05 LAB — JAK2 V617F RFX CALR/MPL/E12-15

## 2024-05-05 LAB — CALR +MPL + E12-E15  (REFLEX)

## 2024-05-06 ENCOUNTER — Inpatient Hospital Stay: Admitting: Oncology

## 2024-05-06 ENCOUNTER — Encounter: Payer: Self-pay | Admitting: Oncology

## 2024-05-06 DIAGNOSIS — D751 Secondary polycythemia: Secondary | ICD-10-CM

## 2024-05-06 DIAGNOSIS — Z148 Genetic carrier of other disease: Secondary | ICD-10-CM | POA: Diagnosis not present

## 2024-05-06 NOTE — Assessment & Plan Note (Signed)
 Since his mother was a carrier of hemochromatosis, we checked HFE gene mutation analysis on 04/22/2024.  There was no evidence of C282Y mutation.  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  No evidence of S65C mutation.  H63D mutation has varying phenotypic expression.  It generally does not cause iron overload issues.  Plan is to pursue therapeutic phlebotomies in case ferritin remains consistently above 200.    On 04/22/2024, ferritin was 160.  Iron studies were all within normal limits.  Currently no indication for therapeutic phlebotomy.

## 2024-05-06 NOTE — Assessment & Plan Note (Signed)
 Chronic polycythemia with elevated hemoglobin levels (18 in June, 17.8 in September) and hematocrit levels (50 in June, 51.7 in September).   Differential diagnoses considered included low oxygen levels, erythropoietin -producing tumor, or JAK2 mutation. No smoking, high altitude exposure, or testosterone use. Family history of leukemia and multiple myeloma. No personal history of blood clots or significant bleeding disorders. No sleep apnea diagnosis.  Discussed potential complications of untreated polycythemia, including thrombosis, stroke-like symptoms, and cardiovascular issues.   On his consultation with us  on 04/22/2024, labs showed hemoglobin of 18.4, hematocrit 49.7.  White count and platelet count were within normal limits.  CMP grossly unremarkable.  Iron studies, ESR, CRP, LDH were all within normal limits. JAK2 V617F mutation analysis, followed by reflex testing to include CALR, MPL, exon 12-15 mutations were all negative.  No evidence of primary myeloproliferative neoplasm.  Erythropoietin  level was also normal.   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  He was encouraged to maintain adequate hydration.  Goal is to maintain hematocrit below 55 with therapeutic phlebotomies as needed to prevent hyperviscosity related complications.  Currently no indication for therapeutic phlebotomy.  - Plan follow-up visit in three months.

## 2024-05-06 NOTE — Progress Notes (Signed)
 [facility]  HEMATOLOGY-ONCOLOGY ELECTRONIC VISIT PROGRESS NOTE  PATIENT NAME: Bradley Douglas   MR#: [id number] DOB: February 19, 1991  DATE OF SERVICE: 05/06/2024  Patient Care Team: Chrystal Lamarr RAMAN, MD as PCP - General (Family Medicine) Darliss Rogue, MD as PCP - Cardiology (Cardiology)  I connected with the patient via telephone conference and verified that I am speaking with the correct person using two identifiers. The patient's location is at home and I am providing care from the [facility].  I discussed the limitations, risks, security and privacy concerns of performing an evaluation and management service by e-visits and the availability of in person appointments. I also discussed with the patient that there may be a patient responsible charge related to this service. The patient expressed understanding and agreed to proceed.   ASSESSMENT & PLAN:   Bradley Douglas is a 33 y.o.  gentleman with past medical history of Migraines, GERD, ADHD, Anxiety, was referred to our clinic in November 2025 for evaluation of polycythemia. JAK2 V617F mutation analysis, followed by reflex testing to include CALR, MPL, exon 12-15 mutations were all negative.  No evidence of primary myeloproliferative neoplasm.   Secondary polycythemia Chronic polycythemia with elevated hemoglobin levels (18 in June, 17.8 in September) and hematocrit levels (50 in June, 51.7 in September).   Differential diagnoses considered included low oxygen levels, erythropoietin -producing tumor, or JAK2 mutation. No smoking, high altitude exposure, or testosterone use. Family history of leukemia and multiple myeloma. No personal history of blood clots or significant bleeding disorders. No sleep apnea diagnosis.  Discussed potential complications of untreated polycythemia, including thrombosis, stroke-like symptoms, and cardiovascular issues.   On his consultation with us  on 04/22/2024, labs showed  hemoglobin of 18.4, hematocrit 49.7.  White count and platelet count were within normal limits.  CMP grossly unremarkable.  Iron studies, ESR, CRP, LDH were all within normal limits. JAK2 V617F mutation analysis, followed by reflex testing to include CALR, MPL, exon 12-15 mutations were all negative.  No evidence of primary myeloproliferative neoplasm.  Erythropoietin  level was also normal.   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  He was encouraged to maintain adequate hydration.  Goal is to maintain hematocrit below 55 with therapeutic phlebotomies as needed to prevent hyperviscosity related complications.  Currently no indication for therapeutic phlebotomy.  - Plan follow-up visit in three months.  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  There was no evidence of C282Y mutation.  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  No evidence of S65C mutation.  H63D mutation has varying phenotypic expression.  It generally does not cause iron overload issues.  Plan is to pursue therapeutic phlebotomies in case ferritin remains consistently above 200.    On 04/22/2024, ferritin was 160.  Iron studies were all within normal limits.  Currently no indication for therapeutic phlebotomy.    I discussed the assessment and treatment plan with the patient. The patient was provided an opportunity to ask questions and all were answered. The patient agreed with the plan and demonstrated an understanding of the instructions. The patient was advised to call back or seek an in-person evaluation if the symptoms worsen or if the condition fails to improve as anticipated.    I spent 15 minutes over the phone with the patient reviewing test results, discuss management and coordination/planning of care.  Chinita Patten, MD 05/06/2024  1:50 PM [facility] - A DEPT OF [facility] [address] [place] [address] Dept: [phone] Dept Fax: [phone]   INTERVAL HISTORY:  Please see above for problem oriented charting.  The purpose of today's discussion is to explain recent lab results and to formulate plan of care.  Discussed the use of AI scribe software for clinical note transcription with the patient, who gave verbal consent to proceed.  History of Present Illness Bradley Douglas is a 33 year old male who presents for follow-up on elevated hemoglobin levels.  His hemoglobin level remains elevated at 18.4, with previous levels of 17.4 and 17.3. Hematocrit is 49.7. Extensive testing, including a JAK2 mutation test and an extended panel for other rare mutations (CALR, MPL, and exon 12-15), were negative. Erythropoietin  levels were normal. Inflammatory markers and other blood counts were normal.  He has a family history of hemochromatosis. Genetic testing revealed he does not have the C282Y mutation but is homozygous for the H63D mutation. His ferritin level is 160, and his hematocrit is below 55.  His liver enzyme ALT was previously elevated and is now 48.    SUMMARY OF HEMATOLOGY HISTORY:  He was referred by his primary care doctor for evaluation of elevated hemoglobin levels.   He has had elevated liver enzymes and hemoglobin levels for the past year. Three sets of blood work over this period showed hemoglobin levels of 18 in June and 17.8 in September, both above the normal range of 13 to 17. His white and red blood cell counts have remained normal. No history of smoking, tobacco use, or testosterone supplements. He works from home in [employer] and does not work in environments with low oxygen levels such as basements or high altitudes.   He has experienced elevated liver enzymes continuously over the past year, with an ALT of 74 in June, above the  normal range of up to 44. He drinks alcohol occasionally, about once or twice a month socially, and rarely uses Tylenol , only for severe headaches. He was previously on Truvada for HIV prevention but discontinued it due to concerns about liver enzyme elevation.   There is a family history of hemochromatosis, with his mother having one gene for the condition. His grandfather had leukemia, and his grandmother had multiple myeloma. There is also a family history of Von Willebrand disease, but he has not experienced any significant bleeding issues himself.   He reports occasional snoring but generally sleeps well and feels refreshed upon waking. He has experienced headaches, particularly after prolonged screen time, and had his first migraine in June, which included right-sided numbness. He has had one other migraine since then, which was less severe and resolved quickly. No history of blood clots, chest congestion, or significant swelling in the legs, although he notes occasional mild swelling in the stomach area.  Chronic polycythemia with elevated hemoglobin levels (18 in June, 17.8 in September 2025) and hematocrit levels (50 in June, 51.7 in September).    Differential diagnoses considered included low oxygen levels, erythropoietin -producing tumor, or JAK2 mutation. No smoking, high altitude exposure, or testosterone use. Family history of leukemia and multiple myeloma. No personal history of blood clots or significant bleeding disorders. No sleep apnea diagnosis.   Discussed potential complications of untreated polycythemia, including thrombosis, stroke-like symptoms, and cardiovascular issues.    On his consultation with us  on 04/22/2024, labs showed hemoglobin of 18.4, hematocrit 49.7.  White count and platelet count were within normal limits.  CMP grossly unremarkable.  Iron studies, ESR, CRP, LDH were all within normal limits. JAK2 V617F mutation analysis, followed by reflex testing to include  CALR, MPL, exon 12-15 mutations were all negative.  No evidence of primary myeloproliferative neoplasm.  Erythropoietin  level was also normal.   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  He was encouraged to maintain adequate hydration.  Goal is to maintain hematocrit below 55 with therapeutic phlebotomies as needed to prevent hyperviscosity related complications.  Currently no indication for therapeutic phlebotomy.  Since his mother was a carrier of hemochromatosis, we checked HFE gene mutation analysis on 04/22/2024.  There was no evidence of C282Y mutation.  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  No evidence of S65C mutation.  H63D mutation has varying phenotypic expression.  It generally does not cause iron overload issues.  Plan is to pursue therapeutic phlebotomies in case ferritin remains consistently above 200.  Currently no indication for this.  REVIEW OF SYSTEMS:    Review of Systems - Oncology  All other pertinent systems were reviewed with the patient and are negative.  I have reviewed the past medical history, past surgical history, social history and family history with the patient and they are unchanged from previous note.  ALLERGIES:  He is allergic to amoxicillin.  MEDICATIONS:  Current Outpatient Medications  Medication Sig Dispense Refill   buPROPion  (WELLBUTRIN  XL) 150 MG 24 hr tablet Take 150 mg by mouth daily. (Patient not taking: Reported on 04/22/2024)     buPROPion  (WELLBUTRIN  XL) 300 MG 24 hr tablet Take 300 mg by mouth every morning.     doxycycline (VIBRA-TABS) 100 MG tablet Take by mouth.     emtricitabine-tenofovir (TRUVADA) 200-300 MG tablet Take 1 tablet by mouth daily. (Patient not taking: Reported on 04/22/2024)     loratadine (CLARITIN) 10 MG tablet Take 10 mg by mouth daily as needed.     Methylphenidate HCl ER, PM, (JORNAY PM) 40 MG CP24 Take 40 mg by mouth at bedtime.      SODIUM FLUORIDE 5000 PPM 1.1 % PSTE Take 1 Application by mouth as directed.     triamcinolone (NASACORT) 55 MCG/ACT AERO nasal inhaler Place 2 sprays into the nose daily as needed (allergies).     No current facility-administered medications for this visit.    PHYSICAL EXAMINATION:  Not performed today as it was a phone only visit  LABORATORY DATA:   I have reviewed the data as listed.  Recent Results (from the past 2160 hours)  Hemochromatosis DNA, PCR     Status: None   Collection Time: 04/22/24 11:06 AM  Result Value Ref Range   DNA Mutation Analysis Comment     Comment: (NOTE) Results: c.845G>A (p.Cys282Tyr) - Not Detected c.187C>G (p.His63Asp) - Detected, homozygous c.193A>T (p.Ser65Cys) - Not Detected Not associated with increased risk to develop clinical symptoms of Hereditary Hemochromatosis. In symptomatic individuals, other causes of iron overload should be evaluated. See Additional Information and Comments. Additional Clinical Information: Hereditary hemochromatosis (HFE related) is an autosomal recessive iron storage disorder. Patients may have a genetic diagnosis of hereditary hemochromatosis and never show clinical symptoms. Clinical symptoms typically appear between 40 to 60 years in males and after menopause in females. Signs and symptoms may include organ damage, primarily in the liver, risk for hepatocellular carcinoma, diabetes, and heart disease due to iron accumulation. Life expectancy may be decreased  in individuals who develop cirrhosis. Treatment for clinically symptomatic individuals may include therapeutic phle botomy. Liver transplant may be used to treat end stage liver failure. For preventive care, monitoring for iron overload is recommended for patients who are homozygous for c.845G>A (p.Cys282Tyr) and have yet to experience clinical symptoms. Comments: The most common HFE variants associated with hereditary hemochromatosis are c.845G>A  (p.Cys282Tyr), c.187C>G (p.His63Asp), c.193A>T (p.Ser65Cys). While patients homozygous for c.845G>A (p.Cys282Tyr) are the most likely to present clinical symptoms, less than 10% develop clinically significant iron overload with tissue and organ damage. Genetic counseling is recommended to discuss the potential clinical implications of positive results, as well as recommendations for testing family members. Genetic Coordinators are available for health care providers to discuss results at [phone]). Test Details: Three variants analyzed: c.845G>A (p.Cys282Tyr), commonly referred to as C282Y c.187C>G (p.His63Asp), commonly refe rred to as H63D c.193A>T (p.Ser65Cys), commonly referred to as S65C Methods/Limitations: DNA Analysis of the HFE gene (NM_[id number]) was performed by PCR amplification followed by restriction enzyme digestion analyses. Results must be combined with clinical information for the most accurate interpretation. Molecular-based testing is highly accurate, but as in any laboratory test, diagnostic errors may occur. False positive or false negative results may occur for reasons that include genetic variants, blood transfusions, bone marrow transplantation, somatic or tissue-specific mosaicism, mislabeled samples, or erroneous representation of family relationships. This test was developed and its performance characteristics determined by [facility]. It has not been cleared or approved by the Food and Drug Administration. References: Aldona CAVES, [address] Kowdley SONNA Monte LW, Tavill AS; American Association for the Study of Liver Diseases. Diagnosis and management of hemochromatos is: 2011 practice guideline by the American Association for the Study of Liver Diseases. Hepatology. 2011 Jul;54(1):328-43. doi: 10.1002/hep.24330. PMID: 78547709; PMCID: EFR6850874. [address] Brissot P, Swinkels DW, Zoller H, Kamarainen O, Patton S, Alonso I, Morris M, Keeney S. EMQN  best practice guidelines for the molecular genetic diagnosis of hereditary hemochromatosis [facility]). Eur J Hum Genet. 2016 Apr;24(4):479-95. doi: 10.1038/ejhg.2015.128. Epub 2015 Jul 8. PMID: 73846781; PMCID: EFR5070138.    Reviewed by: Comment     Comment: (NOTE) Technical Component performed at [employer] [facility] Professional Component performed by: Slater LELON Donate, PhD, [facility] [id number], [facility], [address] [place] [address] Performed At: [facility] [address] [place], [place] [id number] Loran Gales MDPhD Ey:[phone]   JAK2 V617F rfx CALR/MPL/E12-15     Status: None   Collection Time: 04/22/24 11:06 AM  Result Value Ref Range   Specimen Type Comment:     Comment: NOT PROVIDED CORRECTED ON 12/01 AT 1335: PREVIOUSLY REPORTED AS DRAWN BY RN    JAK2 V617F Result Comment     Comment: (NOTE) NEGATIVE The JAK2 V617F mutation is not detected in the provided specimen of this individual. Results should be interpreted in conjunction with clinical and other laboratory findings for the most accurate interpretation. This test was developed and its performance characteristics determined by [facility]. It has not been cleared or approved by the Food and Drug Administration.    Reflex Comment     Comment: (NOTE) Reflex to CALR Mutation Analysis, JAK2 Exon 12-15 Mutation Analysis, and MPL Mutation Analysis is indicated.    V617F Rfx CALR/MPL/E12-15 Bkgd Comment     Comment: (NOTE)    Molecular testing of blood or bone marrow is useful in the evaluation of suspected myeloproliferative neoplasms (MPN). Mutations in the JAK2, MPL, and CALR genes are present in virtually all MPNs and their presence help  distinguish benign reactive processes from clonal neoplasms. These mutations are generally considered mutually exclusive, although concurrent clones have been reported in rare patients. This test will assess for the JAK2V617F (exon 14) mutation first and will reflex to CALR mutation  analysis, MPL mutation analysis, and JAK2 exon 12 to 15 mutation analysis if the JAK2V617F mutation is negative.    The JAK2 (Janus kinase 2) gene encodes for a non-receptor protein tyrosine kinase that activates cytokine and growth factor signaling. The V617F (c.1849 G>T) mutation results in constitutive activation of JAK2 and downstream STAT5 and ERK signaling. The V617F mutation is observed in approximately 95% of polycythemia vera (PV), 60% of essential thromboc ythemia (ET) and primary myelofibrosis (PMF). It is also infrequently present (3-5%) in myelodysplastic syndrome, chronic myelomonocytic leukemia, and other atypical chronic myeloid disorders. A small percentage of JAK2 mutation positive patients (3.3%) contain other non-V617F mutations within exons 12 to 15. In particular, mutations in exon 12 of JAK2 have been described in approximately 3% of patients with PV. JAK2 allele burden correlates with clinical phenotype, with low levels of mutant allele characterized by thrombocytosis, intermediate levels with erythrocytosis, and high mutant allele burden correlating with enhanced myelopoiesis of the BM, leukocytosis, increasing spleen size, and circulating CD34-positive cells.    The CALR (Calreticulin) gene encodes for a multifunctional calcium-binding protein involved in many cellular activities such as growth, proliferation, adhesion, and programmed cell death. Among patients with JAK2 negative MPNs, CALR are found in a pproximately 70% of patients with JAK2-negative essential thrombocythemia (ET) and 60-88% of patients with JAK2-negative primary myelofibrosis(PMF). Only a minority of patients (approximately 8%) with myelodysplasia have mutations in the CALR gene. CALR mutations are rarely detected in patients with de novo acute myeloid leukemia, chronic myelogenous leukemia, lymphoid leukemia, or solid tumors. CALR mutations are not detected in polycythemia and generally  appear to be mutually exclusive with JAK2 mutations and MPL mutations. The majority of mutational changes involve a variety of insertion deletion mutations in exon 9 of the calreticulin gene: approximately 53% of all CALR mutations are a 52 bp deletion (type-1) while the second most prevalent mutation (approximately 32%) contains a 5 bp insertion (type-2). Other mutations (non-type 1 or type 2) are seen in a small minority of cases. CALR mutations in PMF tend to be with a favorable prognosis compared to JAK2 V617F trw automotive, whereas primary myelofibrosis negative for CALR, JAK2 V617F and MPL mutations (so-called triple negative) is associated with a poor prognosis and shorter survival.    The MPL (myeloproliferative leukemia virus oncogene) gene encodes the thrombopoietin receptor which regulates hematopoiesis and megakaryopoiesis. Activating MPL mutations are associated with a subset of myeloproliferative neoplasms and acute megakaryoblastic leukemia. MPL W515 mutations are present in approximately 5-8% of patients with primary myelofibrosis (PMF) and 1-4% of patients with essential thrombocythemia (ET). The S505 mutation is detected in patients with hereditary thrombocythemia.    Limitations    This assay has a sensitivity of approximately 1% VAF for JAK2 V617F, 2.5% VAF for other mutations in JAK2 exons 12 to 15, CALR mutations, and MPL mutations. Deletions in JAK2 up to 6 bp and insertions up to 34 bp have been detected in validation studies. Deletions in CALR up to 70 bp and i nsertions up to 12 bp have been detected in validation studies.    Method based next generation sequencing.     Comment: Comment Am[employer]    References Comment     Comment: (NOTE) Alghasham N, Alnouri Y, Abalkhail H, Khalil  S. Detection of mutations in JAK2 exons 12-15 by Sanger sequencing. Int J Lab Hematol. 2016 Feb;38(1):34-41. doi: 10.1111/ijlh.87574. Epub 2015 Sep 11. PMID: 73638915. Glorine ISLE,  Cheryll LABOR, Hasserjian R, Omega PARAS, Borowitz MJ, Ladora Campanile MM, P[place] CD, Cazzola M, Vardiman JW. The 2016 revision to the World Health Organization classification of myeloid neoplasms and acute leukemia. Blood. 2016 May 19;127(20):2391-405. doi: 10.1182/blood-2016-03-643544. Epub 2016 Apr 11. PMID: 72930745. Honor LELON Glennie VEAR Laurita OLEGARIO Halford [facility] [place], Zhang ZJ, [place] S, Albitar M. Mutation profile of JAK2 transcripts in patients with chronic myeloproliferative neoplasias. J Mol Diagn. 2009 Jan;11(1):49-53.doi: 10.2353/jmoldx.2009.080114. Epub 2008 Dec 12. PMID: 80925404; PMCID: EFR7392434. NCCN Clinical Practice Guidelines in Oncology (NCCN Guidelines) Myeloproliferative Neoplasms Version 3.2022 - January 13, 2021. Swerdlow SH, programmer, multimedia. WHO classif ication of Tumours of Haematopoietic and Lymphoid Tissues. 4th edn. Epifanio, [place]: Geologist, Engineering for [employer] on [employer]; 2017. Tefferi A. Primary myelofibrosis: 2021 update on diagnosis, risk-stratification and management. Am J Hematol. 2021 Jan;96(1):145-162. doi: 10.1002/ajh.26050. Epub 2020 Dec 2. PMID: 66802950. Vainchenker W, Kralovics R. Genetic basis and molecular pathophysiology of classical myeloproliferative neoplasms. Blood. 2017 Feb 9;129(6):667-679. doi: 10.1182/blood-2016-10-695940. Epub 2016 Dec 27. PMID: 71971970.    Director Review Comment     Comment: (NOTE) Technical Component performed at [employer] [facility] Professional Component performed by: Rolan Flatten, PhD, [facility] Director, Molecular Oncology [facility] [phone], [address] [place] [address] [phone] Performed At: [facility] [address] [place], [place] [id number] Loran Gales MDPhD Ey:[phone] Performed At: [facility] [address] [place], [place] [id number] Loran Gales MDPhD Ey:[phone]   Erythropoietin      Status: None   Collection Time: 04/22/24 11:06 AM  Result Value Ref Range   Erythropoietin  9.6 2.6 - 18.5  mIU/mL    Comment: (NOTE) Beckman Coulter UniCel DxI 800 Immunoassay System Values obtained with different assay methods or kits cannot be used interchangeably. Results cannot be interpreted as absolute evidence of the presence or absence of malignant disease. Performed At: [facility] [address] [place], [place] [id number] Jennette Shorter MD Ey:[phone]   C-reactive protein     Status: None   Collection Time: 04/22/24 11:06 AM  Result Value Ref Range   CRP 0.6 <1.0 mg/dL    Comment: Performed at [facility], [address], [place], [place] [address]  Sedimentation rate     Status: None   Collection Time: 04/22/24 11:06 AM  Result Value Ref Range   Sed Rate 4 0 - 16 mm/hr    Comment: Performed at [facility], [address] [place], [place] [address]  Ferritin     Status: None   Collection Time: 04/22/24 11:06 AM  Result Value Ref Range   Ferritin 160 24 - 336 ng/mL    Comment: Performed at [facility], [address], [place], [place] [address]  Iron and TIBC     Status: None   Collection Time: 04/22/24 11:06 AM  Result Value Ref Range   Iron 102 45 - 182 ug/dL   TIBC 652 749 - 549 ug/dL   Saturation Ratios 29 17.9 - 39.5 %   UIBC 245 ug/dL    Comment: Performed at [facility], [address] [place], [place] [address]  Lactate dehydrogenase     Status: None   Collection Time: 04/22/24 11:06 AM  Result Value Ref Range   LDH 171 105 - 235 U/L    Comment: Please note change  in reference range. Performed at [facility], [address] [place], [place] [address]   CMP (Cancer Center only)     Status: Abnormal   Collection Time: 04/22/24 11:06 AM  Result Value Ref Range   Sodium 139 135 - 145 mmol/L   Potassium 4.4 3.5 - 5.1 mmol/L   Chloride 101 98 - 111 mmol/L   CO2 30 22 - 32 mmol/L   Glucose, Bld 81 70 - 99 mg/dL    Comment: Glucose reference range applies only to samples  taken after fasting for at least 8 hours.   BUN 10 6 - 20 mg/dL   Creatinine 9.07 9.38 - 1.24 mg/dL   Calcium 9.9 8.9 - 89.6 mg/dL   Total Protein 7.8 6.5 - 8.1 g/dL   Albumin 4.5 3.5 - 5.0 g/dL   AST 28 15 - 41 U/L   ALT 48 (H) 0 - 44 U/L   Alkaline Phosphatase 90 38 - 126 U/L   Total Bilirubin 0.9 0.0 - 1.2 mg/dL   GFR, Estimated >39 >39 mL/min    Comment: (NOTE) Calculated using the CKD-EPI Creatinine Equation (2021)    Anion gap 8 5 - 15    Comment: Performed at [facility], [address], [place], [place] [address]  CBC with Differential (Cancer Center Only)     Status: Abnormal   Collection Time: 04/22/24 11:06 AM  Result Value Ref Range   WBC Count 5.4 4.0 - 10.5 K/uL   RBC 5.60 4.22 - 5.81 MIL/uL   Hemoglobin 18.4 (H) 13.0 - 17.0 g/dL   HCT 50.2 60.9 - 47.9 %   MCV 88.8 80.0 - 100.0 fL   MCH 32.9 26.0 - 34.0 pg   MCHC 37.0 (H) 30.0 - 36.0 g/dL   RDW 89.0 (L) 88.4 - 84.4 %   Platelet Count 285 150 - 400 K/uL   nRBC 0.0 0.0 - 0.2 %   Neutrophils Relative % 48 %   Neutro Abs 2.6 1.7 - 7.7 K/uL   Lymphocytes Relative 43 %   Lymphs Abs 2.3 0.7 - 4.0 K/uL   Monocytes Relative 5 %   Monocytes Absolute 0.3 0.1 - 1.0 K/uL   Eosinophils Relative 3 %   Eosinophils Absolute 0.2 0.0 - 0.5 K/uL   Basophils Relative 1 %   Basophils Absolute 0.0 0.0 - 0.1 K/uL   Immature Granulocytes 0 %   Abs Immature Granulocytes 0.01 0.00 - 0.07 K/uL    Comment: Performed at [facility], [address] [place], [place] [address]  CALR +MPL + E12-E15 (reflexed)     Status: None   Collection Time: 04/22/24 11:06 AM  Result Value Ref Range   CALR Result Comment     Comment: (NOTE) NEGATIVE No insertions or deletions were detected within the analyzed region of the calreticulin (CALR) gene. A negative result does not entirely exclude the possibility of a clonal population carrying CALR gene mutations that are not covered by this assay. Results  should be interpreted in conjunction with clinical and laboratory findings for the most accurate interpretation.    MPL Result Comment     Comment: (NOTE) NEGATIVE No MPL mutation was identified in the provided specimen of this individual. Results should be interpreted in conjunction with clinical and other laboratory findings for the most accurate interpretation.    E12-15 Result Comment     Comment: (NOTE) NEGATIVE    JAK2 mutations were not detected in exons 12, 13, 14 and 15.  The G to T nucleotide change encoding the [id number] mutation was not detected. This result does not rule out the presence of JAK2 mutation at a level below the detection sensitivity of this assay, the presence of other mutations outside the analyzed region of the JAK2 gene, or the presence of a myeloproliferative or other neoplasm. Result must be correlated with other clinical data for the most accurate diagnosis. Performed At: [facility] [address] [place], [place] [id number] Loran Gales MDPhD Ey:[phone]      RADIOGRAPHIC STUDIES:  No recent pertinent imaging studies available to review.  Orders Placed This Encounter  Procedures   CBC with Differential (Cancer Center Only)    Standing Status:   Future    Expected Date:   07/21/2024    Expiration Date:   10/19/2024   CMP (Cancer Center only)    Standing Status:   Future    Expected Date:   07/21/2024    Expiration Date:   10/19/2024   Iron and TIBC    Standing Status:   Future    Expected Date:   07/21/2024    Expiration Date:   10/19/2024   Ferritin    Standing Status:   Future    Expected Date:   07/21/2024    Expiration Date:   10/19/2024     Future Appointments  Date Time Provider Department Center  05/06/2024  3:45 PM Mionna Advincula, Chinita, MD [facility] None  07/21/2024 11:00 AM [facility] PHLEBOTOMIST [facility] None  07/21/2024 11:30 AM Neeka Urista, Chinita, MD [facility] None    This document was completed utilizing speech recognition  software. Grammatical errors, random word insertions, pronoun errors, and incomplete sentences are an occasional consequence of this system due to software limitations, ambient noise, and hardware issues. Any formal questions or concerns about the content, text or information contained within the body of this dictation should be directly addressed to the provider for clarification.

## 2024-05-09 ENCOUNTER — Other Ambulatory Visit (HOSPITAL_BASED_OUTPATIENT_CLINIC_OR_DEPARTMENT_OTHER): Payer: Self-pay

## 2024-05-09 MED ORDER — BUPROPION HCL ER (XL) 150 MG PO TB24
150.0000 mg | ORAL_TABLET | Freq: Every morning | ORAL | 3 refills | Status: AC
  Filled 2024-05-09: qty 90, 90d supply, fill #0

## 2024-05-13 ENCOUNTER — Telehealth: Payer: Self-pay

## 2024-05-13 ENCOUNTER — Other Ambulatory Visit (HOSPITAL_COMMUNITY): Payer: Self-pay

## 2024-05-13 NOTE — Telephone Encounter (Signed)
 [facility] Patient Advocate Encounter  Insurance verification completed.    The patient is insured through [facility].     Ran test claim for TRUVADA  The current 30 day co-pay is $0.LF 05/02/24 NF-07/11/24  Ran test claim for DESCOVY  The current 30 day co-pay is $0.  Ran test claim for APRETUDE  This medication will need a PA.   We will continue to follow to see if copay assistance is needed.  This test claim was processed through [facility]- copay amounts may vary at other pharmacies due to pharmacy/plan contracts, or as the patient moves through the different stages of their insurance plan.

## 2024-05-19 NOTE — Progress Notes (Unsigned)
 NEW REFERRAL TO CPP CLINIC    Date:  05/19/2024   HPI: Bradley Douglas is a 33 y.o. male who presents to the [facility] pharmacy clinic to discuss and initiate PrEP  Referring ID Physician: Dr. 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   BUPROPION  (WELLBUTRIN  XL) 150 MG 24 HR TABLET    Take 1 tablet (150 mg total) by mouth every morning.   BUPROPION  (WELLBUTRIN  XL) 300 MG 24 HR TABLET    Take 300 mg by mouth every morning.   DOXYCYCLINE (VIBRA-TABS) 100 MG TABLET    Take by mouth.   EMTRICITABINE-TENOFOVIR (TRUVADA) 200-300 MG TABLET    Take 1 tablet by mouth daily.   LORATADINE (CLARITIN) 10 MG TABLET    Take 10 mg by mouth daily as needed.   METHYLPHENIDATE HCL ER, PM, (JORNAY PM) 40 MG CP24    Take 40 mg by mouth at bedtime.   SODIUM FLUORIDE 5000 PPM 1.1 % PSTE    Take 1 Application by mouth as directed.   TRIAMCINOLONE (NASACORT) 55 MCG/ACT AERO NASAL INHALER    Place 2 sprays into the nose daily as needed (allergies).  Modified Medications   No medications on file  Discontinued Medications   No medications on file    Allergies: Allergies[1]  Past Medical History: Past Medical History:  Diagnosis Date   ADHD 2015   Anxiety    Chronic otitis media of both ears    childhood   Depression    on treatment during '02-2012   GERD  (gastroesophageal reflux disease)    History of kidney stones    Seasonal allergic rhinitis     Social History: Social History   Socioeconomic History   Marital status: Single    Spouse name: Not on file   Number of children: Not on file   Years of education: Not on file   Highest education level: Not on file  Occupational History   Occupation: mortgage processor   Tobacco Use   Smoking status: Never   Smokeless tobacco: Never  Vaping Use   Vaping status: Never Used  Substance and Sexual Activity   Alcohol use: Yes    Alcohol/week: 6.0 standard drinks of alcohol    Types: 3 Cans of beer, 3 Shots of liquor per week    Comment: once a month   Drug use: No    Comment: delta 10 gummies   Sexual activity: Yes    Birth control/protection: Condom    Comment: Sexual partners: male   Other Topics Concern   Not on file  Social History Narrative   He works as a multimedia programmer.       Social Drivers of Health   Tobacco Use: Low Risk (05/06/2024)   Patient History    Smoking  Tobacco Use: Never    Smokeless Tobacco Use: Never    Passive Exposure: Not on file  Financial Resource Strain: Not on file  Food Insecurity: No Food Insecurity (04/22/2024)   Epic    Worried About Programme Researcher, Broadcasting/film/video in the Last Year: Never true    Ran Out of Food in the Last Year: Never true  Transportation Needs: No Transportation Needs (04/22/2024)   Epic    Lack of Transportation (Medical): No    Lack of Transportation (Non-Medical): No  Physical Activity: Not on file  Stress: Not on file  Social Connections: Not on file  Depression (PHQ2-9): Low Risk (04/22/2024)   Depression (PHQ2-9)    PHQ-2 Score: 0  Alcohol Screen: Not on file  Housing: Low Risk (04/22/2024)   Epic    Unable to Pay for Housing in the Last Year: No    Number of Times Moved in the Last Year: 0    Homeless in the Last Year: No  Utilities: Not At Risk (04/22/2024)   Epic    Threatened with loss of utilities: No   Health Literacy: Not on file        No data to display          Labs:  SCr: Lab Results  Component Value Date   CREATININE 0.92 04/22/2024   CREATININE 1.09 11/14/2023   CREATININE 1.03 02/12/2020   HIV Lab Results  Component Value Date   HIV NON-REACTIVE 02/12/2020   HIV NONREACTIVE 01/19/2014   Hepatitis B No results found for: HEPBSAB, HEPBSAG, HEPBCAB Hepatitis C No results found for: HEPCAB, HCVRNAPCRQN Hepatitis A No results found for: HAV RPR and STI Lab Results  Component Value Date   LABRPR NON REAC 01/19/2014    STI Results GC GC CT CT  02/12/2020  4:12 PM Negative   Negative    01/19/2014 11:04 AM  NEGATIVE   NEGATIVE     Assessment:  Bradley Douglas presents to clinic today to discuss injectable PrEP options. He previously followed PCP through [facility] and took Truvada for several years. Over the last year, he has been concerned with increasing LFTs and thought Truvada could be contributing to the elevations. He stopped Truvada and found that his LFTs improved. Discussed that Truvada should not impact liver function in this manner but certainly open to discussing injectable options with him.  After discussing each injectable option, states he is interested in [name]. Discussed [name] for PrEP. Counseled patient that they will need to complete an oral loading dose. Counseled that patient will take two Sunlenca 300 mg tablets (600 mg total) orally on the first day of their injection and will take two Sunlenca 300 mg tablets (600 mg total) orally the next day regardless of meals. Counseled patient that Sunlenca is two separate subcutaneous injections in the abdomen every 6 months. Reviewed that the main side effects are injection-site soreness and nodules. Discussed measures for relief including cold packs and over-the-counter anti-inflammatories.   He will receive new insurance in January, so we will wait to assess coverage until we receive his  new card information. He has been off of PrEP since August and is ok with delaying start for a few more weeks. Will not repeat HIV testing or check STI testing as he has not been sexually active recently. Could not find hepatitis serology testing on faxed information; will check in January. Updated media tab with most recent lab results from 2025.   States he is active with male  partners and participates in receptive and oral sex; he does not use condoms. Eligible for HPV, flu, COVID, and Tdap vaccines; did not discuss during today's visit.   Plan: - Investigate [name] coverage in January - Follow up for injections once approved   Bradley Douglas, PharmD, CPP, BCIDP, AAHIVP Clinical Pharmacist Practitioner Infectious Diseases Clinical Pharmacist [facility] 05/19/2024, 3:09 PM      [1]  Allergies Allergen Reactions   Amoxicillin Swelling    As a child.  Swelling of face and red blotches

## 2024-05-22 ENCOUNTER — Encounter: Payer: Self-pay | Admitting: Pharmacist

## 2024-05-22 ENCOUNTER — Ambulatory Visit: Admitting: Pharmacist

## 2024-05-22 ENCOUNTER — Other Ambulatory Visit: Payer: Self-pay

## 2024-05-22 DIAGNOSIS — Z2981 Encounter for HIV pre-exposure prophylaxis: Secondary | ICD-10-CM

## 2024-05-22 DIAGNOSIS — Z113 Encounter for screening for infections with a predominantly sexual mode of transmission: Secondary | ICD-10-CM

## 2024-05-22 DIAGNOSIS — Z79899 Other long term (current) drug therapy: Secondary | ICD-10-CM

## 2024-05-23 ENCOUNTER — Other Ambulatory Visit (HOSPITAL_BASED_OUTPATIENT_CLINIC_OR_DEPARTMENT_OTHER): Payer: Self-pay

## 2024-05-23 MED ORDER — JORNAY PM 40 MG PO CP24
40.0000 mg | ORAL_CAPSULE | Freq: Every evening | ORAL | 0 refills | Status: DC
  Filled 2024-05-23: qty 30, 30d supply, fill #0

## 2024-06-11 ENCOUNTER — Telehealth: Payer: Self-pay

## 2024-06-11 ENCOUNTER — Other Ambulatory Visit (HOSPITAL_COMMUNITY): Payer: Self-pay

## 2024-06-11 NOTE — Telephone Encounter (Signed)
 Can you start a [name] PA for him? Thank you!

## 2024-06-11 NOTE — Telephone Encounter (Signed)
 [facility] Patient Advocate Encounter  Insurance verification completed.    The patient is insured through [employer]  .     Ran test claim for TRUVADA  The current 30 day co-pay is $0. PATIENT IS CURRENTLY ON THIS MEDICATION   Ran test claim for DESCOVY The current 30 day co-pay is $0.  Ran test claim for APRETUDE  The current 30 day co-pay is $0.  Ran test claim for [name] TABS The current 30 day co-pay is $50.  Ran test claim for [name] INJ  This medication will need a Prior Auth.  We will continue to follow to see if copay assistance is needed.  This test claim was processed through [facility]- copay amounts may vary at other pharmacies due to pharmacy/plan contracts, or as the patient moves through the different stages of their insurance plan.

## 2024-06-16 ENCOUNTER — Other Ambulatory Visit (HOSPITAL_COMMUNITY): Payer: Self-pay

## 2024-06-16 NOTE — Telephone Encounter (Signed)
 CO-PAY CARD FOR [name]  INJECTION AND TABS

## 2024-06-16 NOTE — Telephone Encounter (Signed)
 Pharmacy Patient Advocate Encounter  Received notification from [facility] that Prior Authorization for [name]  INJ has been CANCELLED due to COVERED UNDER MEDICAL BENEFITS.    PA #/Case ID/Reference #: [id number]

## 2024-06-17 ENCOUNTER — Other Ambulatory Visit (HOSPITAL_COMMUNITY): Payer: Self-pay

## 2024-06-17 ENCOUNTER — Telehealth: Payer: Self-pay

## 2024-06-17 ENCOUNTER — Other Ambulatory Visit: Payer: Self-pay

## 2024-06-17 DIAGNOSIS — Z79899 Other long term (current) drug therapy: Secondary | ICD-10-CM

## 2024-06-17 MED ORDER — YEZTUGO 300 MG PO TABS
600.0000 mg | ORAL_TABLET | Freq: Every day | ORAL | 0 refills | Status: AC
  Filled 2024-06-17: qty 4, 2d supply, fill #0

## 2024-06-17 NOTE — Progress Notes (Signed)
 Pharmacy Progress Note  Spoke with patient over the phone and confirmed that Bradley Douglas  has been approved by insurance. Explained the dosing for Bradley Douglas . Bradley Douglas confirmed he is still interested in receiving Bradley Douglas . Scheduled an appointment for Bradley Douglas next week on Tuesday, 1/20 with Bradley Douglas for first injections. Prescription for Bradley Douglas  tablets has been sent to [facility] pharmacy to get them ready for 1/20.   Bradley Douglas, PharmD PGY1 Pharmacy Resident [facility] 06/17/2024 11:10 AM

## 2024-06-17 NOTE — Progress Notes (Signed)
 Specialty Pharmacy Initial Fill Coordination Note  DA AUTHEMENT is a 34 y.o. male contacted today regarding initial fill of specialty medication(s) Lenacapavir  Sodium ([name] )   Patient requested Courier to Provider Office   Delivery date: 06/19/24   Verified address: [facility] [address] [place] [address]   Medication will be filled on: 06/17/24   Patient is aware of $50 copayment.    Copay card on file for this patient

## 2024-06-18 ENCOUNTER — Other Ambulatory Visit: Payer: Self-pay

## 2024-06-18 ENCOUNTER — Other Ambulatory Visit (HOSPITAL_COMMUNITY): Payer: Self-pay

## 2024-06-19 ENCOUNTER — Other Ambulatory Visit (HOSPITAL_COMMUNITY): Payer: Self-pay

## 2024-06-19 ENCOUNTER — Other Ambulatory Visit: Payer: Self-pay

## 2024-06-19 NOTE — Progress Notes (Signed)
 The pharmacy is still waiting for [name]  to come in inventory. The patient's appointment is on Tuesday 06/24/24. [facility] will receive supply on 1/19, pending inventory. Courtney at [facility] aware.

## 2024-06-20 ENCOUNTER — Telehealth: Payer: Self-pay

## 2024-06-20 NOTE — Telephone Encounter (Signed)
 [facility] Patient Advocate Encounter  Patient's medications [name]  Tablt have been couriered to [facility] from [facility] and will be administered at the patients appointment on 06/24/24.  Arland Hutchinson, CPhT Specialty Pharmacy Patient [facility] Phone: [phone] Fax:  [phone]

## 2024-06-23 NOTE — Progress Notes (Unsigned)
 "  HPI: Bradley Douglas is a 34 y.o. male who presents to the [facility] pharmacy clinic for their first [name]  injection and HIV PrEP follow up.  Referring ID Provider: Dr. 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   BUPROPION  (WELLBUTRIN  XL) 300 MG 24 HR TABLET    Take 300 mg by mouth every morning.   DOXYCYCLINE (VIBRA-TABS) 100 MG TABLET    Take by mouth.   LORATADINE (CLARITIN) 10 MG TABLET    Take 10 mg by mouth daily as needed.   METHYLPHENIDATE  HCL ER, PM, (JORNAY PM ) 40 MG CP24    Take 40 mg by mouth at bedtime.   METHYLPHENIDATE  HCL ER, PM, (JORNAY PM ) 40 MG CP24    Take 1 capsule (40 mg total) by mouth every evening.   SODIUM FLUORIDE 5000 PPM 1.1 % PSTE    Take 1 Application by mouth as directed.   TRIAMCINOLONE (NASACORT) 55 MCG/ACT AERO NASAL INHALER    Place 2 sprays into the nose daily as needed (allergies).  Modified Medications   No medications on file  Discontinued Medications   No medications on file    Allergies: Allergies[1]  Labs: No results found for: HIV1RNAQUANT, HIV1RNAVL, CD4TABS  RPR and STI Lab Results  Component Value Date   LABRPR NON REAC 01/19/2014    STI Results GC GC CT CT  02/12/2020  4:12 PM Negative   Negative    01/19/2014 11:04 AM  NEGATIVE   NEGATIVE     Hepatitis B No results found for: HEPBSAB,  HEPBSAG, HEPBCAB Hepatitis C No results found for: HEPCAB, HCVRNAPCRQN Hepatitis A No results found for: HAV Lipids: Lab Results  Component Value Date   CHOL 184 02/12/2020   TRIG 227.0 (H) 02/12/2020   HDL 46.50 02/12/2020   CHOLHDL 4 02/12/2020   VLDL 45.4 (H) 02/12/2020     Target Date: 20th  Assessment: Bradley Douglas presents to clinic for his initial [name]  (lenacapavir ) injections for HIV PrEP. Screened for acute HIV symptoms including fatigue, sore throat, rash, fever and weakness. *** any symptoms. No new partners since last visit and *** STI testing today.   Counseled [name]  will be administered as an initial loading dose consisting of two 300 mg tablets (600 mg total) today, two 463.5 mg/1.5 mL (927 mg/3 mL total) subQ injections into the abdomen today and two 300 mg tablets (600 mg total) tomorrow. Counseled the maintenance regimen will be two 463.5 mg/1.5 mL (927 mg/3 mL total) subQ injections into the abdomen every 6 months. Counseled about injection site reaction of painful nodules that may last up to 6 months. Counseled this pain/soreness may be relieved with ice packs and OTC pain relievers. Counseled on the importance of adhering to the target window of 28 days around the target date to ensure optimal protection against HIV. No  drug interactions noted.  Administered two [name]  463.5 mg/1.5 mL subQ injections on the *** side of the abdomen two inches from the belly button and four inches apart. Administered two 300 mg oral tablets today. Monitored the patient for 10 minutes for any signs of allergic reaction. No symptoms noted. Counseled to call with any issues that may arise. Will schedule follow-up injections for six months from now.  Plan: - [name]  tablets (600 mg) administered - [name]  injections (927 mg) administered - Take two [name]  tablets (600 mg total) tomorrow - HIV antibody today - Follow-up injections scheduled for *** - Call with any  questions/concerns  Maurilio Patten, PharmD PGY1 Pharmacy Resident [facility] 06/23/2024 8:52 PM    [1]  Allergies Allergen Reactions   Amoxicillin Swelling    As a child.  Swelling of face and red blotches   "

## 2024-06-24 ENCOUNTER — Other Ambulatory Visit (HOSPITAL_BASED_OUTPATIENT_CLINIC_OR_DEPARTMENT_OTHER): Payer: Self-pay

## 2024-06-24 ENCOUNTER — Other Ambulatory Visit: Payer: Self-pay

## 2024-06-24 ENCOUNTER — Ambulatory Visit: Admitting: Pharmacist

## 2024-06-24 DIAGNOSIS — Z2981 Encounter for HIV pre-exposure prophylaxis: Secondary | ICD-10-CM

## 2024-06-24 DIAGNOSIS — Z113 Encounter for screening for infections with a predominantly sexual mode of transmission: Secondary | ICD-10-CM

## 2024-06-24 DIAGNOSIS — Z79899 Other long term (current) drug therapy: Secondary | ICD-10-CM

## 2024-06-24 MED ORDER — LENACAPAVIR SODIUM 463.5 MG/1.5ML ~~LOC~~ SOLN
927.0000 mg | Freq: Once | SUBCUTANEOUS | Status: AC
  Administered 2024-06-24: 927 mg via SUBCUTANEOUS

## 2024-06-24 MED ORDER — JORNAY PM 60 MG PO CP24
60.0000 mg | ORAL_CAPSULE | Freq: Every evening | ORAL | 0 refills | Status: AC
  Filled 2024-06-24: qty 30, 30d supply, fill #0

## 2024-06-24 NOTE — Progress Notes (Unsigned)
 SABRA

## 2024-06-25 ENCOUNTER — Other Ambulatory Visit (HOSPITAL_BASED_OUTPATIENT_CLINIC_OR_DEPARTMENT_OTHER): Payer: Self-pay

## 2024-06-25 LAB — TEST AUTHORIZATION: TEST NAME:: 498

## 2024-06-25 LAB — HEPATITIS B SURFACE ANTIBODY,QUALITATIVE: Hep B S Ab: REACTIVE — AB

## 2024-06-25 LAB — HEPATITIS B CORE ANTIBODY, TOTAL: Hep B Core Total Ab: NONREACTIVE

## 2024-06-25 LAB — HEPATITIS C ANTIBODY: Hepatitis C Ab: NONREACTIVE

## 2024-06-25 LAB — HEPATITIS A ANTIBODY, TOTAL: Hepatitis A AB,Total: NONREACTIVE

## 2024-06-25 LAB — HEPATITIS B SURFACE ANTIGEN: Hepatitis B Surface Ag: NONREACTIVE

## 2024-06-25 LAB — HIV ANTIBODY (ROUTINE TESTING W REFLEX)
HIV 1&2 Ab, 4th Generation: NONREACTIVE
HIV FINAL INTERPRETATION: NEGATIVE

## 2024-07-01 ENCOUNTER — Other Ambulatory Visit: Payer: Self-pay

## 2024-07-10 ENCOUNTER — Other Ambulatory Visit (HOSPITAL_BASED_OUTPATIENT_CLINIC_OR_DEPARTMENT_OTHER): Payer: Self-pay

## 2024-07-10 MED ORDER — JORNAY PM 40 MG PO CP24
40.0000 mg | ORAL_CAPSULE | Freq: Every evening | ORAL | 0 refills | Status: AC
  Filled 2024-07-10: qty 30, 30d supply, fill #0

## 2024-07-21 ENCOUNTER — Inpatient Hospital Stay: Admitting: Oncology

## 2024-07-21 ENCOUNTER — Inpatient Hospital Stay

## 2024-12-09 ENCOUNTER — Ambulatory Visit: Payer: Self-pay | Admitting: Pharmacist
